# Patient Record
Sex: Female | Born: 1965 | Race: Black or African American | Hispanic: No | Smoking: Former smoker
Health system: Southern US, Community
[De-identification: ages and names within clinical notes are randomized; demographics above are authoritative.]

## PROBLEM LIST (undated history)

## (undated) DIAGNOSIS — I10 Essential (primary) hypertension: Secondary | ICD-10-CM

## (undated) DIAGNOSIS — B009 Herpesviral infection, unspecified: Secondary | ICD-10-CM

## (undated) DIAGNOSIS — E785 Hyperlipidemia, unspecified: Secondary | ICD-10-CM

## (undated) DIAGNOSIS — M545 Low back pain, unspecified: Secondary | ICD-10-CM

## (undated) DIAGNOSIS — Z973 Presence of spectacles and contact lenses: Secondary | ICD-10-CM

## (undated) DIAGNOSIS — M1990 Unspecified osteoarthritis, unspecified site: Secondary | ICD-10-CM

## (undated) DIAGNOSIS — E119 Type 2 diabetes mellitus without complications: Secondary | ICD-10-CM

## (undated) DIAGNOSIS — G709 Myoneural disorder, unspecified: Secondary | ICD-10-CM

## (undated) DIAGNOSIS — Z87442 Personal history of urinary calculi: Secondary | ICD-10-CM

## (undated) DIAGNOSIS — K219 Gastro-esophageal reflux disease without esophagitis: Secondary | ICD-10-CM

## (undated) DIAGNOSIS — D649 Anemia, unspecified: Secondary | ICD-10-CM

## (undated) DIAGNOSIS — T8859XA Other complications of anesthesia, initial encounter: Secondary | ICD-10-CM

## (undated) DIAGNOSIS — Z8669 Personal history of other diseases of the nervous system and sense organs: Secondary | ICD-10-CM

## (undated) DIAGNOSIS — K259 Gastric ulcer, unspecified as acute or chronic, without hemorrhage or perforation: Secondary | ICD-10-CM

## (undated) DIAGNOSIS — G4730 Sleep apnea, unspecified: Secondary | ICD-10-CM

## (undated) DIAGNOSIS — B019 Varicella without complication: Secondary | ICD-10-CM

## (undated) DIAGNOSIS — K449 Diaphragmatic hernia without obstruction or gangrene: Secondary | ICD-10-CM

## (undated) DIAGNOSIS — E039 Hypothyroidism, unspecified: Secondary | ICD-10-CM

## (undated) DIAGNOSIS — K279 Peptic ulcer, site unspecified, unspecified as acute or chronic, without hemorrhage or perforation: Secondary | ICD-10-CM

## (undated) DIAGNOSIS — E1169 Type 2 diabetes mellitus with other specified complication: Secondary | ICD-10-CM

## (undated) DIAGNOSIS — T7840XA Allergy, unspecified, initial encounter: Secondary | ICD-10-CM

## (undated) DIAGNOSIS — J45909 Unspecified asthma, uncomplicated: Secondary | ICD-10-CM

## (undated) DIAGNOSIS — F32A Depression, unspecified: Secondary | ICD-10-CM

## (undated) DIAGNOSIS — Z1211 Encounter for screening for malignant neoplasm of colon: Secondary | ICD-10-CM

## (undated) DIAGNOSIS — F419 Anxiety disorder, unspecified: Secondary | ICD-10-CM

## (undated) DIAGNOSIS — Z8719 Personal history of other diseases of the digestive system: Secondary | ICD-10-CM

## (undated) DIAGNOSIS — F329 Major depressive disorder, single episode, unspecified: Secondary | ICD-10-CM

## (undated) DIAGNOSIS — T4145XA Adverse effect of unspecified anesthetic, initial encounter: Secondary | ICD-10-CM

## (undated) DIAGNOSIS — E0500 Thyrotoxicosis with diffuse goiter without thyrotoxic crisis or storm: Secondary | ICD-10-CM

## (undated) HISTORY — DX: Low back pain, unspecified: M54.50

## (undated) HISTORY — DX: Type 2 diabetes mellitus with other specified complication: E11.69

## (undated) HISTORY — DX: Type 2 diabetes mellitus without complications: E11.9

## (undated) HISTORY — PX: ESOPHAGOGASTRODUODENOSCOPY: SHX1529

## (undated) HISTORY — DX: Allergy, unspecified, initial encounter: T78.40XA

## (undated) HISTORY — PX: ABDOMINAL HYSTERECTOMY: SHX81

## (undated) HISTORY — DX: Depression, unspecified: F32.A

## (undated) HISTORY — DX: Diaphragmatic hernia without obstruction or gangrene: K44.9

## (undated) HISTORY — DX: Encounter for screening for malignant neoplasm of colon: Z12.11

## (undated) HISTORY — DX: Sleep apnea, unspecified: G47.30

## (undated) HISTORY — DX: Essential (primary) hypertension: I10

## (undated) HISTORY — DX: Type 2 diabetes mellitus with other specified complication: E78.5

## (undated) HISTORY — DX: Peptic ulcer, site unspecified, unspecified as acute or chronic, without hemorrhage or perforation: K27.9

## (undated) HISTORY — DX: Unspecified asthma, uncomplicated: J45.909

## (undated) HISTORY — DX: Low back pain: M54.5

## (undated) HISTORY — DX: Gastro-esophageal reflux disease without esophagitis: K21.9

## (undated) HISTORY — DX: Hypothyroidism, unspecified: E03.9

## (undated) HISTORY — DX: Gastric ulcer, unspecified as acute or chronic, without hemorrhage or perforation: K25.9

## (undated) HISTORY — DX: Anxiety disorder, unspecified: F41.9

## (undated) HISTORY — PX: OTHER SURGICAL HISTORY: SHX169

## (undated) HISTORY — DX: Thyrotoxicosis with diffuse goiter without thyrotoxic crisis or storm: E05.00

## (undated) HISTORY — DX: Herpesviral infection, unspecified: B00.9

## (undated) HISTORY — DX: Major depressive disorder, single episode, unspecified: F32.9

## (undated) HISTORY — DX: Varicella without complication: B01.9

## (undated) SURGICAL SUPPLY — 15 items
CANNULA ANT/CHMB 27GA (MISCELLANEOUS) ×2 IMPLANT
GLOVE SURG LX 7.5 STRW (GLOVE) ×1
GLOVE SURG TRIUMPH 8.0 PF LTX (GLOVE) ×1
GOWN STRL REUS W/TWL LRG LVL3 (GOWN DISPOSABLE) ×2
LENS IOL TECNIS ITEC 21.0 (Intraocular Lens) ×1 IMPLANT
MARKER SKIN DUAL TIP RULER LAB (MISCELLANEOUS) ×1
NEEDLE FILTER BLUNT 18X 1/2SAF (NEEDLE) ×1
PACK CATARACT BRASINGTON (MISCELLANEOUS) ×1
PACK EYE AFTER SURG (MISCELLANEOUS) ×1
PACK OPTHALMIC (MISCELLANEOUS) ×1
SYR 3ML LL SCALE MARK (SYRINGE) ×1
SYR 5ML LL (SYRINGE) ×1
SYR TB 1ML LUER SLIP (SYRINGE) ×1
WATER STERILE IRR 500ML POUR (IV SOLUTION) ×1
WIPE NON LINTING 3.25X3.25 (MISCELLANEOUS) ×1

## (undated) SURGICAL SUPPLY — 15 items
CANNULA ANT/CHMB 27GA (MISCELLANEOUS) ×2 IMPLANT
GLOVE SURG LX 7.5 STRW (GLOVE) ×1
GLOVE SURG TRIUMPH 8.0 PF LTX (GLOVE) ×1
GOWN STRL REUS W/TWL LRG LVL3 (GOWN DISPOSABLE) ×2
LENS IOL TECNIS ITEC 20.5 (Intraocular Lens) ×1 IMPLANT
MARKER SKIN DUAL TIP RULER LAB (MISCELLANEOUS) ×1
NEEDLE FILTER BLUNT 18X 1/2SAF (NEEDLE) ×1
PACK CATARACT BRASINGTON (MISCELLANEOUS) ×1
PACK EYE AFTER SURG (MISCELLANEOUS) ×1
PACK OPTHALMIC (MISCELLANEOUS) ×1
SYR 3ML LL SCALE MARK (SYRINGE) ×1
SYR 5ML LL (SYRINGE) ×1
SYR TB 1ML LUER SLIP (SYRINGE) ×1
WATER STERILE IRR 500ML POUR (IV SOLUTION) ×1
WIPE NON LINTING 3.25X3.25 (MISCELLANEOUS) ×1

## (undated) SURGICAL SUPPLY — 12 items
BLOCK BITE 60FR ADLT L/F BLUE (MISCELLANEOUS) ×1
ELECT REM PT RETURN 9FT ADLT (ELECTROSURGICAL)
FORCEP RJ3 GP 1.8X160 W-NEEDLE (CUTTING FORCEPS)
FORCEPS BIOP RAD 4 LRG CAP 4 (CUTTING FORCEPS)
NEEDLE SCLEROTHERAPY 25GX240 (NEEDLE)
PROBE APC STR FIRE (PROBE)
PROBE INJECTION GOLD (MISCELLANEOUS)
SNARE SHORT THROW 13M SML OVAL (MISCELLANEOUS)
SYR 50ML LL SCALE MARK (SYRINGE)
TUBING ENDO SMARTCAP PENTAX (MISCELLANEOUS) ×2
TUBING IRRIGATION ENDOGATOR (MISCELLANEOUS) ×1
WATER STERILE IRR 1000ML POUR (IV SOLUTION)

## (undated) SURGICAL SUPPLY — 64 items
APL PRP STRL LF DISP 70% ISPRP (MISCELLANEOUS) ×9
APL SKNCLS STERI-STRIP NONHPOA (GAUZE/BANDAGES/DRESSINGS) ×6
BENZOIN TINCTURE PRP APPL 2/3 (GAUZE/BANDAGES/DRESSINGS) ×2
BINDER ABDOMINAL 12 SM 30-45 (SOFTGOODS) ×1
BLADE CLIPPER SURG (BLADE)
BLADE SURG 10 STRL SS (BLADE) ×5
BLADE SURG 11 STRL SS (BLADE) ×1
BLADE SURG 15 STRL SS (BLADE) ×4
BNDG CMPR MED 10X6 ELC LF (GAUZE/BANDAGES/DRESSINGS) ×3
BNDG ELASTIC 6X10 VLCR STRL LF (GAUZE/BANDAGES/DRESSINGS) ×1
CANISTER SUCT 1200ML W/VALVE (MISCELLANEOUS) ×1
CHLORAPREP W/TINT 26 (MISCELLANEOUS) ×3
COVER BACK TABLE 60X90IN (DRAPES) ×1
COVER MAYO STAND STRL (DRAPES) ×1
DRAIN CHANNEL 15F RND FF W/TCR (WOUND CARE)
DRAIN CHANNEL 19F RND (DRAIN) ×2
DRAIN PENROSE .5X12 LATEX STL (DRAIN) ×2
DRAPE LAPAROSCOPIC ABDOMINAL (DRAPES) ×1
DRAPE UTILITY XL STRL (DRAPES) ×2
DRSG PAD ABDOMINAL 8X10 ST (GAUZE/BANDAGES/DRESSINGS) ×4
ELECT BLADE 4.0 EZ CLEAN MEGAD (MISCELLANEOUS) ×8
ELECT COATED BLADE 2.86 ST (ELECTRODE)
ELECT REM PT RETURN 9FT ADLT (ELECTROSURGICAL) ×8
EVACUATOR SILICONE 100CC (DRAIN) ×2
GAUZE SPONGE 4X4 12PLY STRL (GAUZE/BANDAGES/DRESSINGS) ×2
GLOVE BIO SURGEON STRL SZ7.5 (GLOVE) ×2
GLOVE BIOGEL M STRL SZ7.5 (GLOVE) ×1
GLOVE BIOGEL PI INDICATOR 8 (GLOVE) ×2
GOWN STRL REUS W/TWL LRG LVL3 (GOWN DISPOSABLE) ×12
GOWN STRL REUS W/TWL XL LVL3 (GOWN DISPOSABLE) ×8
MARKER SKIN DUAL TIP RULER LAB (MISCELLANEOUS)
NEEDLE FILTER BLUNT 18X 1/2SAF (NEEDLE) ×1
NEEDLE HYPO 18GX1.5 SHARP (NEEDLE) ×4
NEEDLE HYPO 25X1 1.5 SAFETY (NEEDLE)
NEEDLE SPNL 18GX3.5 QUINCKE PK (NEEDLE) ×1
NS IRRIG 1000ML POUR BTL (IV SOLUTION) ×1
PACK BASIN DAY SURGERY FS (CUSTOM PROCEDURE TRAY) ×1
PENCIL SMOKE EVACUATOR (MISCELLANEOUS) ×2
PIN SAFETY STERILE (MISCELLANEOUS) ×1
SLEEVE SCD COMPRESS KNEE MED (STOCKING) ×1
SPONGE T-LAP 18X18 ~~LOC~~+RFID (SPONGE) ×6
STAPLER INSORB 30 2030 C-SECTI (MISCELLANEOUS) ×2
STAPLER VISISTAT 35W (STAPLE) ×1
STRIP SUTURE WOUND CLOSURE 1/2 (MISCELLANEOUS) ×3
SUT ETHILON 2 0 FS 18 (SUTURE) ×2
SUT MNCRL AB 4-0 PS2 18 (SUTURE) ×6
SUT NOVA 0 T19/GS 22DT (SUTURE) ×1
SUT PDS 3-0 CT2 (SUTURE) ×16
SUT PDS AB 0 CT 36 (SUTURE)
SUT PDS AB 2-0 CT2 27 (SUTURE)
SUT VIC AB 2-0 CT1 27 (SUTURE) ×20
SUT VIC AB 3-0 PS1 18 (SUTURE)
SUT VLOC 180 0 24IN GS25 (SUTURE)
SUT VLOC 180 P-14 24 (SUTURE)
SUT VLOC 90 P-14 23 (SUTURE) ×5
SYR 50ML LL SCALE MARK (SYRINGE) ×1
SYR BULB IRRIG 60ML STRL (SYRINGE) ×1
SYR CONTROL 10ML LL (SYRINGE)
TAPE MEASURE VINYL STERILE (MISCELLANEOUS)
TOWEL GREEN STERILE FF (TOWEL DISPOSABLE) ×2
TUBE CONNECTING 20X1/4 (TUBING) ×1
TUBING INFILTRATION IT-10001 (TUBING) ×1
UNDERPAD 30X36 HEAVY ABSORB (UNDERPADS AND DIAPERS) ×2
YANKAUER SUCT BULB TIP NO VENT (SUCTIONS) ×1

## (undated) SURGICAL SUPPLY — 2 items
ELECT REM PT RETURN 9FT ADLT (ELECTROSURGICAL)
PROBE INJECTION GOLD (MISCELLANEOUS)

---
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
## 1998-08-31 HISTORY — PX: CHOLECYSTECTOMY: SHX55

## 2001-08-31 HISTORY — PX: PARTIAL HYSTERECTOMY: SHX80

## 2006-08-31 HISTORY — PX: LAPAROSCOPIC GASTRIC BYPASS: SUR771

## 2011-09-01 HISTORY — PX: ROTATOR CUFF REPAIR: SHX139

## 2013-05-23 DIAGNOSIS — E1169 Type 2 diabetes mellitus with other specified complication: Secondary | ICD-10-CM | POA: Insufficient documentation

## 2013-05-23 DIAGNOSIS — D649 Anemia, unspecified: Secondary | ICD-10-CM | POA: Insufficient documentation

## 2013-05-23 DIAGNOSIS — E785 Hyperlipidemia, unspecified: Secondary | ICD-10-CM | POA: Insufficient documentation

## 2013-05-23 HISTORY — DX: Anemia, unspecified: D64.9

## 2013-07-18 DIAGNOSIS — N62 Hypertrophy of breast: Secondary | ICD-10-CM | POA: Insufficient documentation

## 2013-07-18 HISTORY — DX: Hypertrophy of breast: N62

## 2016-12-08 DIAGNOSIS — G63 Polyneuropathy in diseases classified elsewhere: Secondary | ICD-10-CM | POA: Diagnosis not present

## 2016-12-08 DIAGNOSIS — E0500 Thyrotoxicosis with diffuse goiter without thyrotoxic crisis or storm: Secondary | ICD-10-CM | POA: Diagnosis not present

## 2016-12-09 DIAGNOSIS — Z1231 Encounter for screening mammogram for malignant neoplasm of breast: Secondary | ICD-10-CM

## 2016-12-15 DIAGNOSIS — E0500 Thyrotoxicosis with diffuse goiter without thyrotoxic crisis or storm: Secondary | ICD-10-CM | POA: Diagnosis not present

## 2016-12-15 DIAGNOSIS — E119 Type 2 diabetes mellitus without complications: Secondary | ICD-10-CM | POA: Diagnosis not present

## 2016-12-25 DIAGNOSIS — E785 Hyperlipidemia, unspecified: Secondary | ICD-10-CM | POA: Diagnosis not present

## 2016-12-25 DIAGNOSIS — B1089 Other human herpesvirus infection: Secondary | ICD-10-CM | POA: Diagnosis not present

## 2016-12-25 DIAGNOSIS — E559 Vitamin D deficiency, unspecified: Secondary | ICD-10-CM | POA: Diagnosis not present

## 2016-12-28 DIAGNOSIS — E538 Deficiency of other specified B group vitamins: Secondary | ICD-10-CM | POA: Diagnosis not present

## 2017-02-11 DIAGNOSIS — E782 Mixed hyperlipidemia: Secondary | ICD-10-CM | POA: Diagnosis not present

## 2017-02-11 DIAGNOSIS — E890 Postprocedural hypothyroidism: Secondary | ICD-10-CM | POA: Diagnosis not present

## 2017-02-11 DIAGNOSIS — I10 Essential (primary) hypertension: Secondary | ICD-10-CM | POA: Diagnosis not present

## 2017-04-02 DIAGNOSIS — I10 Essential (primary) hypertension: Secondary | ICD-10-CM | POA: Diagnosis not present

## 2017-04-02 DIAGNOSIS — E119 Type 2 diabetes mellitus without complications: Secondary | ICD-10-CM | POA: Diagnosis not present

## 2017-04-05 DIAGNOSIS — E119 Type 2 diabetes mellitus without complications: Secondary | ICD-10-CM | POA: Diagnosis not present

## 2017-04-05 DIAGNOSIS — E785 Hyperlipidemia, unspecified: Secondary | ICD-10-CM | POA: Diagnosis not present

## 2017-04-05 DIAGNOSIS — I10 Essential (primary) hypertension: Secondary | ICD-10-CM | POA: Diagnosis not present

## 2017-04-16 DIAGNOSIS — Z8711 Personal history of peptic ulcer disease: Secondary | ICD-10-CM

## 2017-04-16 DIAGNOSIS — Z8719 Personal history of other diseases of the digestive system: Secondary | ICD-10-CM

## 2017-04-16 DIAGNOSIS — Z1211 Encounter for screening for malignant neoplasm of colon: Secondary | ICD-10-CM

## 2017-04-30 HISTORY — DX: Personal history of other diseases of the nervous system and sense organs: Z86.69

## 2017-04-30 HISTORY — DX: Presence of spectacles and contact lenses: Z97.3

## 2017-07-05 DIAGNOSIS — E119 Type 2 diabetes mellitus without complications: Secondary | ICD-10-CM | POA: Diagnosis not present

## 2017-07-05 DIAGNOSIS — E890 Postprocedural hypothyroidism: Secondary | ICD-10-CM | POA: Diagnosis not present

## 2017-08-13 DIAGNOSIS — E890 Postprocedural hypothyroidism: Secondary | ICD-10-CM | POA: Diagnosis not present

## 2017-08-17 DIAGNOSIS — E890 Postprocedural hypothyroidism: Secondary | ICD-10-CM | POA: Diagnosis not present

## 2017-08-17 DIAGNOSIS — E119 Type 2 diabetes mellitus without complications: Secondary | ICD-10-CM | POA: Diagnosis not present

## 2017-09-08 VITALS — BP 126/58 | HR 87 | Temp 98.1°F | Resp 16 | Ht 61.75 in | Wt 208.0 lb

## 2017-09-08 DIAGNOSIS — N907 Vulvar cyst: Secondary | ICD-10-CM

## 2017-09-08 DIAGNOSIS — N6001 Solitary cyst of right breast: Secondary | ICD-10-CM

## 2017-09-08 DIAGNOSIS — G4700 Insomnia, unspecified: Secondary | ICD-10-CM

## 2017-09-08 DIAGNOSIS — E119 Type 2 diabetes mellitus without complications: Secondary | ICD-10-CM

## 2017-09-08 DIAGNOSIS — E890 Postprocedural hypothyroidism: Secondary | ICD-10-CM

## 2017-09-08 DIAGNOSIS — I10 Essential (primary) hypertension: Secondary | ICD-10-CM

## 2017-09-08 DIAGNOSIS — L739 Follicular disorder, unspecified: Secondary | ICD-10-CM

## 2017-09-08 DIAGNOSIS — F419 Anxiety disorder, unspecified: Secondary | ICD-10-CM | POA: Insufficient documentation

## 2017-09-08 DIAGNOSIS — J4520 Mild intermittent asthma, uncomplicated: Secondary | ICD-10-CM

## 2017-09-08 DIAGNOSIS — F4321 Adjustment disorder with depressed mood: Secondary | ICD-10-CM

## 2017-09-08 DIAGNOSIS — E669 Obesity, unspecified: Secondary | ICD-10-CM

## 2017-09-08 DIAGNOSIS — Z1231 Encounter for screening mammogram for malignant neoplasm of breast: Secondary | ICD-10-CM

## 2017-09-08 DIAGNOSIS — J45909 Unspecified asthma, uncomplicated: Secondary | ICD-10-CM | POA: Insufficient documentation

## 2017-09-08 HISTORY — DX: Adjustment disorder with depressed mood: F43.21

## 2017-09-08 HISTORY — DX: Insomnia, unspecified: G47.00

## 2017-09-08 HISTORY — DX: Postprocedural hypothyroidism: E89.0

## 2017-09-08 HISTORY — DX: Type 2 diabetes mellitus without complications: E11.9

## 2017-09-08 HISTORY — DX: Solitary cyst of right breast: N60.01

## 2017-09-08 HISTORY — DX: Essential (primary) hypertension: I10

## 2017-09-08 HISTORY — DX: Vulvar cyst: N90.7

## 2017-10-07 DIAGNOSIS — Z0289 Encounter for other administrative examinations: Secondary | ICD-10-CM

## 2017-10-22 DIAGNOSIS — E119 Type 2 diabetes mellitus without complications: Secondary | ICD-10-CM

## 2017-11-22 DIAGNOSIS — G4700 Insomnia, unspecified: Secondary | ICD-10-CM

## 2017-11-25 VITALS — BP 132/70 | HR 89 | Temp 97.9°F | Ht 61.75 in | Wt 206.6 lb

## 2017-11-25 DIAGNOSIS — F32A Depression, unspecified: Secondary | ICD-10-CM

## 2017-11-25 DIAGNOSIS — G4700 Insomnia, unspecified: Secondary | ICD-10-CM

## 2017-11-25 DIAGNOSIS — E119 Type 2 diabetes mellitus without complications: Secondary | ICD-10-CM

## 2017-11-25 DIAGNOSIS — I10 Essential (primary) hypertension: Secondary | ICD-10-CM | POA: Diagnosis not present

## 2017-11-25 DIAGNOSIS — E890 Postprocedural hypothyroidism: Secondary | ICD-10-CM

## 2017-11-25 DIAGNOSIS — E669 Obesity, unspecified: Secondary | ICD-10-CM

## 2017-11-25 DIAGNOSIS — J4520 Mild intermittent asthma, uncomplicated: Secondary | ICD-10-CM

## 2017-11-25 DIAGNOSIS — E559 Vitamin D deficiency, unspecified: Secondary | ICD-10-CM | POA: Insufficient documentation

## 2017-11-25 DIAGNOSIS — F419 Anxiety disorder, unspecified: Secondary | ICD-10-CM | POA: Insufficient documentation

## 2017-11-25 DIAGNOSIS — F329 Major depressive disorder, single episode, unspecified: Secondary | ICD-10-CM

## 2017-11-25 HISTORY — DX: Depression, unspecified: F32.A

## 2017-11-25 HISTORY — DX: Anxiety disorder, unspecified: F41.9

## 2017-12-07 DIAGNOSIS — E559 Vitamin D deficiency, unspecified: Secondary | ICD-10-CM

## 2017-12-14 VITALS — BP 106/64 | HR 91 | Temp 97.9°F | Ht 61.75 in | Wt 212.8 lb

## 2017-12-14 DIAGNOSIS — Z720 Tobacco use: Secondary | ICD-10-CM

## 2017-12-14 DIAGNOSIS — J069 Acute upper respiratory infection, unspecified: Secondary | ICD-10-CM

## 2017-12-14 DIAGNOSIS — M5441 Lumbago with sciatica, right side: Secondary | ICD-10-CM | POA: Diagnosis not present

## 2017-12-27 VITALS — BP 110/64 | HR 94 | Temp 98.1°F | Ht 61.75 in | Wt 203.8 lb

## 2017-12-27 DIAGNOSIS — E669 Obesity, unspecified: Secondary | ICD-10-CM | POA: Diagnosis not present

## 2017-12-27 DIAGNOSIS — J069 Acute upper respiratory infection, unspecified: Secondary | ICD-10-CM

## 2017-12-27 DIAGNOSIS — G8929 Other chronic pain: Secondary | ICD-10-CM

## 2017-12-27 DIAGNOSIS — M5441 Lumbago with sciatica, right side: Secondary | ICD-10-CM

## 2017-12-27 DIAGNOSIS — M545 Low back pain, unspecified: Secondary | ICD-10-CM

## 2017-12-27 DIAGNOSIS — G4700 Insomnia, unspecified: Secondary | ICD-10-CM | POA: Diagnosis not present

## 2017-12-27 DIAGNOSIS — Z720 Tobacco use: Secondary | ICD-10-CM

## 2017-12-27 DIAGNOSIS — Z1231 Encounter for screening mammogram for malignant neoplasm of breast: Secondary | ICD-10-CM | POA: Diagnosis not present

## 2017-12-27 DIAGNOSIS — I10 Essential (primary) hypertension: Secondary | ICD-10-CM

## 2017-12-27 DIAGNOSIS — M5442 Lumbago with sciatica, left side: Secondary | ICD-10-CM

## 2017-12-27 HISTORY — DX: Low back pain, unspecified: M54.50

## 2017-12-27 HISTORY — DX: Other chronic pain: G89.29

## 2018-01-25 VITALS — BP 118/68 | HR 102 | Temp 98.0°F | Resp 16 | Ht 61.75 in | Wt 205.5 lb

## 2018-01-25 DIAGNOSIS — M5442 Lumbago with sciatica, left side: Secondary | ICD-10-CM

## 2018-01-25 DIAGNOSIS — F419 Anxiety disorder, unspecified: Secondary | ICD-10-CM

## 2018-01-25 DIAGNOSIS — G4700 Insomnia, unspecified: Secondary | ICD-10-CM

## 2018-01-25 DIAGNOSIS — M5441 Lumbago with sciatica, right side: Secondary | ICD-10-CM

## 2018-01-25 DIAGNOSIS — E119 Type 2 diabetes mellitus without complications: Secondary | ICD-10-CM

## 2018-01-25 DIAGNOSIS — M5416 Radiculopathy, lumbar region: Secondary | ICD-10-CM | POA: Diagnosis not present

## 2018-01-25 DIAGNOSIS — E890 Postprocedural hypothyroidism: Secondary | ICD-10-CM

## 2018-01-25 DIAGNOSIS — I10 Essential (primary) hypertension: Secondary | ICD-10-CM | POA: Diagnosis not present

## 2018-01-25 DIAGNOSIS — E669 Obesity, unspecified: Secondary | ICD-10-CM

## 2018-01-25 HISTORY — DX: Radiculopathy, lumbar region: M54.16

## 2018-02-01 DIAGNOSIS — E119 Type 2 diabetes mellitus without complications: Secondary | ICD-10-CM | POA: Diagnosis not present

## 2018-02-01 DIAGNOSIS — E890 Postprocedural hypothyroidism: Secondary | ICD-10-CM | POA: Diagnosis not present

## 2018-03-07 DIAGNOSIS — Z6837 Body mass index (BMI) 37.0-37.9, adult: Principal | ICD-10-CM

## 2018-03-30 VITALS — BP 108/76 | HR 110 | Temp 97.6°F | Ht 61.75 in | Wt 188.4 lb

## 2018-03-30 DIAGNOSIS — E119 Type 2 diabetes mellitus without complications: Secondary | ICD-10-CM

## 2018-03-30 DIAGNOSIS — I10 Essential (primary) hypertension: Secondary | ICD-10-CM

## 2018-03-30 DIAGNOSIS — M5442 Lumbago with sciatica, left side: Secondary | ICD-10-CM

## 2018-03-30 DIAGNOSIS — M5416 Radiculopathy, lumbar region: Secondary | ICD-10-CM | POA: Diagnosis not present

## 2018-03-30 DIAGNOSIS — M5441 Lumbago with sciatica, right side: Secondary | ICD-10-CM

## 2018-03-30 DIAGNOSIS — R112 Nausea with vomiting, unspecified: Secondary | ICD-10-CM | POA: Diagnosis not present

## 2018-03-30 DIAGNOSIS — E559 Vitamin D deficiency, unspecified: Secondary | ICD-10-CM | POA: Diagnosis not present

## 2018-03-30 DIAGNOSIS — G4700 Insomnia, unspecified: Secondary | ICD-10-CM

## 2018-03-31 DIAGNOSIS — Z9884 Bariatric surgery status: Secondary | ICD-10-CM | POA: Diagnosis not present

## 2018-03-31 DIAGNOSIS — R112 Nausea with vomiting, unspecified: Secondary | ICD-10-CM | POA: Diagnosis not present

## 2018-03-31 DIAGNOSIS — Z9049 Acquired absence of other specified parts of digestive tract: Secondary | ICD-10-CM | POA: Diagnosis not present

## 2018-04-01 DIAGNOSIS — R112 Nausea with vomiting, unspecified: Secondary | ICD-10-CM

## 2018-04-08 VITALS — BP 106/70 | HR 100 | Temp 98.1°F | Ht 61.75 in | Wt 192.8 lb

## 2018-04-08 DIAGNOSIS — E119 Type 2 diabetes mellitus without complications: Secondary | ICD-10-CM

## 2018-04-08 DIAGNOSIS — I10 Essential (primary) hypertension: Secondary | ICD-10-CM

## 2018-04-08 DIAGNOSIS — R112 Nausea with vomiting, unspecified: Secondary | ICD-10-CM | POA: Diagnosis not present

## 2018-04-08 DIAGNOSIS — R5383 Other fatigue: Secondary | ICD-10-CM | POA: Diagnosis not present

## 2018-04-08 DIAGNOSIS — R609 Edema, unspecified: Secondary | ICD-10-CM

## 2018-04-08 DIAGNOSIS — M5416 Radiculopathy, lumbar region: Secondary | ICD-10-CM

## 2018-04-08 HISTORY — DX: Nausea with vomiting, unspecified: R11.2

## 2018-04-08 HISTORY — DX: Edema, unspecified: R60.9

## 2018-04-13 VITALS — BP 137/80 | HR 112 | Resp 18 | Ht 61.0 in | Wt 184.2 lb

## 2018-04-13 DIAGNOSIS — R112 Nausea with vomiting, unspecified: Secondary | ICD-10-CM

## 2018-04-13 DIAGNOSIS — R634 Abnormal weight loss: Secondary | ICD-10-CM

## 2018-04-13 DIAGNOSIS — Z1211 Encounter for screening for malignant neoplasm of colon: Secondary | ICD-10-CM

## 2018-04-28 DIAGNOSIS — J45909 Unspecified asthma, uncomplicated: Secondary | ICD-10-CM | POA: Insufficient documentation

## 2018-04-28 DIAGNOSIS — K222 Esophageal obstruction: Secondary | ICD-10-CM | POA: Diagnosis not present

## 2018-04-28 DIAGNOSIS — K644 Residual hemorrhoidal skin tags: Secondary | ICD-10-CM | POA: Diagnosis not present

## 2018-04-28 DIAGNOSIS — K219 Gastro-esophageal reflux disease without esophagitis: Secondary | ICD-10-CM | POA: Insufficient documentation

## 2018-04-28 DIAGNOSIS — Z90710 Acquired absence of both cervix and uterus: Secondary | ICD-10-CM | POA: Insufficient documentation

## 2018-04-28 DIAGNOSIS — Z809 Family history of malignant neoplasm, unspecified: Secondary | ICD-10-CM | POA: Insufficient documentation

## 2018-04-28 DIAGNOSIS — F329 Major depressive disorder, single episode, unspecified: Secondary | ICD-10-CM | POA: Diagnosis not present

## 2018-04-28 DIAGNOSIS — K9189 Other postprocedural complications and disorders of digestive system: Secondary | ICD-10-CM | POA: Diagnosis not present

## 2018-04-28 DIAGNOSIS — F419 Anxiety disorder, unspecified: Secondary | ICD-10-CM | POA: Diagnosis not present

## 2018-04-28 DIAGNOSIS — Z8711 Personal history of peptic ulcer disease: Secondary | ICD-10-CM | POA: Diagnosis not present

## 2018-04-28 DIAGNOSIS — D122 Benign neoplasm of ascending colon: Secondary | ICD-10-CM | POA: Diagnosis not present

## 2018-04-28 DIAGNOSIS — I10 Essential (primary) hypertension: Secondary | ICD-10-CM | POA: Diagnosis not present

## 2018-04-28 DIAGNOSIS — Z811 Family history of alcohol abuse and dependence: Secondary | ICD-10-CM | POA: Insufficient documentation

## 2018-04-28 DIAGNOSIS — G4730 Sleep apnea, unspecified: Secondary | ICD-10-CM | POA: Diagnosis not present

## 2018-04-28 DIAGNOSIS — E785 Hyperlipidemia, unspecified: Secondary | ICD-10-CM | POA: Insufficient documentation

## 2018-04-28 DIAGNOSIS — E119 Type 2 diabetes mellitus without complications: Secondary | ICD-10-CM | POA: Insufficient documentation

## 2018-04-28 DIAGNOSIS — K648 Other hemorrhoids: Secondary | ICD-10-CM | POA: Insufficient documentation

## 2018-04-28 DIAGNOSIS — E890 Postprocedural hypothyroidism: Secondary | ICD-10-CM | POA: Diagnosis not present

## 2018-04-28 DIAGNOSIS — R112 Nausea with vomiting, unspecified: Secondary | ICD-10-CM | POA: Insufficient documentation

## 2018-04-28 DIAGNOSIS — Z825 Family history of asthma and other chronic lower respiratory diseases: Secondary | ICD-10-CM | POA: Insufficient documentation

## 2018-04-28 DIAGNOSIS — Y832 Surgical operation with anastomosis, bypass or graft as the cause of abnormal reaction of the patient, or of later complication, without mention of misadventure at the time of the procedure: Secondary | ICD-10-CM | POA: Insufficient documentation

## 2018-04-28 DIAGNOSIS — Z8489 Family history of other specified conditions: Secondary | ICD-10-CM | POA: Insufficient documentation

## 2018-04-28 DIAGNOSIS — Z9049 Acquired absence of other specified parts of digestive tract: Secondary | ICD-10-CM | POA: Diagnosis not present

## 2018-04-28 DIAGNOSIS — K635 Polyp of colon: Secondary | ICD-10-CM | POA: Diagnosis not present

## 2018-04-28 DIAGNOSIS — Z833 Family history of diabetes mellitus: Secondary | ICD-10-CM | POA: Insufficient documentation

## 2018-04-28 DIAGNOSIS — Z1211 Encounter for screening for malignant neoplasm of colon: Secondary | ICD-10-CM | POA: Insufficient documentation

## 2018-04-28 DIAGNOSIS — F17210 Nicotine dependence, cigarettes, uncomplicated: Secondary | ICD-10-CM | POA: Insufficient documentation

## 2018-04-28 DIAGNOSIS — Z9884 Bariatric surgery status: Secondary | ICD-10-CM | POA: Insufficient documentation

## 2018-04-28 DIAGNOSIS — D126 Benign neoplasm of colon, unspecified: Secondary | ICD-10-CM | POA: Diagnosis not present

## 2018-04-28 DIAGNOSIS — Z8261 Family history of arthritis: Secondary | ICD-10-CM | POA: Insufficient documentation

## 2018-04-28 DIAGNOSIS — Z8249 Family history of ischemic heart disease and other diseases of the circulatory system: Secondary | ICD-10-CM | POA: Insufficient documentation

## 2018-04-28 DIAGNOSIS — Z9109 Other allergy status, other than to drugs and biological substances: Secondary | ICD-10-CM | POA: Insufficient documentation

## 2018-04-28 DIAGNOSIS — Z7951 Long term (current) use of inhaled steroids: Secondary | ICD-10-CM | POA: Insufficient documentation

## 2018-04-28 DIAGNOSIS — Z79899 Other long term (current) drug therapy: Secondary | ICD-10-CM | POA: Insufficient documentation

## 2018-04-28 DIAGNOSIS — Z91040 Latex allergy status: Secondary | ICD-10-CM | POA: Insufficient documentation

## 2018-04-28 HISTORY — PX: COLONOSCOPY WITH PROPOFOL: SHX5780

## 2018-04-28 HISTORY — PX: ESOPHAGOGASTRODUODENOSCOPY (EGD) WITH PROPOFOL: SHX5813

## 2018-04-28 MED ADMIN — Propofol IV Emul 500 MG/50ML (10 MG/ML): 120 ug/kg/min | INTRAVENOUS | NDC 00409469933

## 2018-04-28 MED ADMIN — Fentanyl Citrate Preservative Free (PF) Inj 100 MCG/2ML: 25 ug | INTRAVENOUS | NDC 00409909422

## 2018-04-28 MED ADMIN — Fentanyl Citrate Preservative Free (PF) Inj 100 MCG/2ML: 50 ug | INTRAVENOUS | NDC 00409909422

## 2018-04-28 MED ADMIN — Butamben-Tetracaine-Benzocaine Aerosol Spray 2-2-14%: 2 | TOPICAL | NDC 10223020104

## 2018-04-28 MED ADMIN — Midazolam HCl Inj 2 MG/2ML (Base Equivalent): 2 mg | INTRAVENOUS | NDC 63323041112

## 2018-04-28 MED ADMIN — Lidocaine HCl(Cardiac) IV PF Soln Pref Syr 100 MG/5ML (2%): 30 mg | INTRAVENOUS | NDC 00409132305

## 2018-04-28 MED ADMIN — Sodium Chloride IV Soln 0.9%: 1000 mL | INTRAVENOUS | NDC 00338004904

## 2018-04-29 VITALS — BP 130/78 | HR 97 | Temp 98.2°F | Ht 61.0 in | Wt 186.8 lb

## 2018-04-29 DIAGNOSIS — Z9884 Bariatric surgery status: Secondary | ICD-10-CM

## 2018-04-29 DIAGNOSIS — K311 Adult hypertrophic pyloric stenosis: Secondary | ICD-10-CM

## 2018-04-29 DIAGNOSIS — I10 Essential (primary) hypertension: Secondary | ICD-10-CM

## 2018-04-29 DIAGNOSIS — G8929 Other chronic pain: Secondary | ICD-10-CM

## 2018-04-29 DIAGNOSIS — M545 Low back pain: Secondary | ICD-10-CM

## 2018-04-29 DIAGNOSIS — R112 Nausea with vomiting, unspecified: Secondary | ICD-10-CM | POA: Diagnosis not present

## 2018-04-29 HISTORY — DX: Bariatric surgery status: Z98.84

## 2018-05-05 DIAGNOSIS — R112 Nausea with vomiting, unspecified: Secondary | ICD-10-CM

## 2018-05-19 DIAGNOSIS — R112 Nausea with vomiting, unspecified: Secondary | ICD-10-CM | POA: Diagnosis not present

## 2018-05-19 DIAGNOSIS — K9189 Other postprocedural complications and disorders of digestive system: Secondary | ICD-10-CM | POA: Diagnosis not present

## 2018-05-19 DIAGNOSIS — Z9884 Bariatric surgery status: Secondary | ICD-10-CM | POA: Diagnosis not present

## 2018-05-19 DIAGNOSIS — E119 Type 2 diabetes mellitus without complications: Secondary | ICD-10-CM | POA: Diagnosis not present

## 2018-06-10 DIAGNOSIS — S3633XA Laceration of stomach, initial encounter: Secondary | ICD-10-CM | POA: Diagnosis not present

## 2018-06-10 DIAGNOSIS — K631 Perforation of intestine (nontraumatic): Secondary | ICD-10-CM | POA: Diagnosis not present

## 2018-06-10 DIAGNOSIS — E11649 Type 2 diabetes mellitus with hypoglycemia without coma: Secondary | ICD-10-CM | POA: Diagnosis not present

## 2018-06-10 DIAGNOSIS — R1013 Epigastric pain: Secondary | ICD-10-CM | POA: Diagnosis not present

## 2018-06-10 DIAGNOSIS — R1012 Left upper quadrant pain: Secondary | ICD-10-CM | POA: Diagnosis not present

## 2018-06-10 DIAGNOSIS — K9189 Other postprocedural complications and disorders of digestive system: Secondary | ICD-10-CM | POA: Diagnosis not present

## 2018-06-10 DIAGNOSIS — E43 Unspecified severe protein-calorie malnutrition: Secondary | ICD-10-CM | POA: Diagnosis not present

## 2018-06-10 DIAGNOSIS — Z9884 Bariatric surgery status: Secondary | ICD-10-CM | POA: Diagnosis not present

## 2018-06-10 DIAGNOSIS — R51 Headache: Secondary | ICD-10-CM | POA: Diagnosis not present

## 2018-06-14 MED ORDER — GENERIC EXTERNAL MEDICATION
40.00 | Status: DC
Start: 2018-06-15 — End: 2018-06-14

## 2018-06-14 MED ORDER — GENERIC EXTERNAL MEDICATION
12.50 | Status: DC
Start: ? — End: 2018-06-14

## 2018-06-14 MED ORDER — DOCUSATE SODIUM 150 MG/15ML PO LIQD
100.00 | ORAL | Status: DC
Start: 2018-06-14 — End: 2018-06-14

## 2018-06-14 MED ORDER — ACETAMINOPHEN 650 MG/20.3ML PO SOLN
1000.00 | ORAL | Status: DC
Start: 2018-06-15 — End: 2018-06-14

## 2018-06-14 MED ORDER — INSULIN LISPRO 100 UNIT/ML ~~LOC~~ SOLN
0.00 | SUBCUTANEOUS | Status: DC
Start: 2018-06-14 — End: 2018-06-14

## 2018-06-14 MED ORDER — ONDANSETRON 4 MG PO TBDP
4.00 | ORAL_TABLET | ORAL | Status: DC
Start: 2018-06-15 — End: 2018-06-14

## 2018-06-14 MED ORDER — GENERIC EXTERNAL MEDICATION
5.00 | Status: DC
Start: ? — End: 2018-06-14

## 2018-06-14 MED ORDER — SCOPOLAMINE 1 MG/3DAYS TD PT72
1.00 | MEDICATED_PATCH | TRANSDERMAL | Status: DC
Start: 2018-06-16 — End: 2018-06-14

## 2018-06-14 MED ORDER — HEPARIN SODIUM (PORCINE) 5000 UNIT/ML IJ SOLN
5000.00 | INTRAMUSCULAR | Status: DC
Start: 2018-06-14 — End: 2018-06-14

## 2018-06-14 MED ORDER — DEXTROSE 10 % IV SOLN
25.00 | INTRAVENOUS | Status: DC
Start: ? — End: 2018-06-14

## 2018-06-14 MED ORDER — GENERIC EXTERNAL MEDICATION
66.00 | Status: DC
Start: 2018-06-15 — End: 2018-06-14

## 2018-06-17 DIAGNOSIS — E039 Hypothyroidism, unspecified: Secondary | ICD-10-CM

## 2018-06-30 VITALS — BP 116/64 | HR 90 | Temp 98.3°F | Ht 61.0 in | Wt 180.0 lb

## 2018-06-30 DIAGNOSIS — K311 Adult hypertrophic pyloric stenosis: Secondary | ICD-10-CM | POA: Diagnosis not present

## 2018-06-30 DIAGNOSIS — Z9884 Bariatric surgery status: Secondary | ICD-10-CM | POA: Diagnosis not present

## 2018-06-30 DIAGNOSIS — R112 Nausea with vomiting, unspecified: Secondary | ICD-10-CM

## 2018-06-30 DIAGNOSIS — D62 Acute posthemorrhagic anemia: Secondary | ICD-10-CM | POA: Diagnosis not present

## 2018-06-30 DIAGNOSIS — M5441 Lumbago with sciatica, right side: Secondary | ICD-10-CM

## 2018-06-30 DIAGNOSIS — I10 Essential (primary) hypertension: Secondary | ICD-10-CM | POA: Diagnosis not present

## 2018-06-30 DIAGNOSIS — M5442 Lumbago with sciatica, left side: Secondary | ICD-10-CM

## 2018-07-01 DIAGNOSIS — D62 Acute posthemorrhagic anemia: Secondary | ICD-10-CM | POA: Insufficient documentation

## 2018-07-04 DIAGNOSIS — K285 Chronic or unspecified gastrojejunal ulcer with perforation: Secondary | ICD-10-CM | POA: Diagnosis not present

## 2018-07-04 DIAGNOSIS — K311 Adult hypertrophic pyloric stenosis: Secondary | ICD-10-CM | POA: Diagnosis not present

## 2018-07-04 DIAGNOSIS — Z9884 Bariatric surgery status: Secondary | ICD-10-CM | POA: Diagnosis not present

## 2018-08-02 DIAGNOSIS — G4700 Insomnia, unspecified: Secondary | ICD-10-CM

## 2018-08-22 DIAGNOSIS — E039 Hypothyroidism, unspecified: Secondary | ICD-10-CM | POA: Diagnosis not present

## 2018-08-22 DIAGNOSIS — Z9884 Bariatric surgery status: Secondary | ICD-10-CM | POA: Diagnosis not present

## 2018-08-22 DIAGNOSIS — E119 Type 2 diabetes mellitus without complications: Secondary | ICD-10-CM | POA: Diagnosis not present

## 2018-09-01 DIAGNOSIS — F17200 Nicotine dependence, unspecified, uncomplicated: Secondary | ICD-10-CM | POA: Insufficient documentation

## 2018-09-01 DIAGNOSIS — E782 Mixed hyperlipidemia: Secondary | ICD-10-CM | POA: Insufficient documentation

## 2018-09-01 HISTORY — DX: Mixed hyperlipidemia: E78.2

## 2018-09-26 DIAGNOSIS — E876 Hypokalemia: Secondary | ICD-10-CM

## 2018-09-26 DIAGNOSIS — E559 Vitamin D deficiency, unspecified: Secondary | ICD-10-CM

## 2018-09-26 DIAGNOSIS — E785 Hyperlipidemia, unspecified: Secondary | ICD-10-CM

## 2018-09-26 DIAGNOSIS — E611 Iron deficiency: Secondary | ICD-10-CM

## 2018-09-26 DIAGNOSIS — E039 Hypothyroidism, unspecified: Secondary | ICD-10-CM

## 2018-09-26 DIAGNOSIS — E119 Type 2 diabetes mellitus without complications: Secondary | ICD-10-CM

## 2018-09-26 DIAGNOSIS — E538 Deficiency of other specified B group vitamins: Secondary | ICD-10-CM

## 2018-09-26 DIAGNOSIS — I10 Essential (primary) hypertension: Secondary | ICD-10-CM

## 2018-09-30 VITALS — BP 122/84 | HR 112 | Temp 97.6°F | Ht 61.0 in | Wt 185.6 lb

## 2018-09-30 DIAGNOSIS — K311 Adult hypertrophic pyloric stenosis: Secondary | ICD-10-CM | POA: Diagnosis not present

## 2018-09-30 DIAGNOSIS — F32A Depression, unspecified: Secondary | ICD-10-CM

## 2018-09-30 DIAGNOSIS — E119 Type 2 diabetes mellitus without complications: Secondary | ICD-10-CM

## 2018-09-30 DIAGNOSIS — F419 Anxiety disorder, unspecified: Secondary | ICD-10-CM

## 2018-09-30 DIAGNOSIS — Z23 Encounter for immunization: Secondary | ICD-10-CM | POA: Diagnosis not present

## 2018-09-30 DIAGNOSIS — E890 Postprocedural hypothyroidism: Secondary | ICD-10-CM

## 2018-09-30 DIAGNOSIS — I10 Essential (primary) hypertension: Secondary | ICD-10-CM

## 2018-09-30 DIAGNOSIS — F329 Major depressive disorder, single episode, unspecified: Secondary | ICD-10-CM

## 2018-09-30 DIAGNOSIS — R112 Nausea with vomiting, unspecified: Secondary | ICD-10-CM

## 2018-10-07 DIAGNOSIS — Z1231 Encounter for screening mammogram for malignant neoplasm of breast: Secondary | ICD-10-CM | POA: Diagnosis not present

## 2018-10-07 DIAGNOSIS — N6001 Solitary cyst of right breast: Secondary | ICD-10-CM | POA: Diagnosis not present

## 2018-10-10 DIAGNOSIS — R928 Other abnormal and inconclusive findings on diagnostic imaging of breast: Secondary | ICD-10-CM

## 2018-10-10 DIAGNOSIS — E119 Type 2 diabetes mellitus without complications: Secondary | ICD-10-CM | POA: Diagnosis not present

## 2018-10-10 DIAGNOSIS — Z9884 Bariatric surgery status: Secondary | ICD-10-CM | POA: Diagnosis not present

## 2018-10-10 DIAGNOSIS — N6489 Other specified disorders of breast: Secondary | ICD-10-CM

## 2018-10-20 DIAGNOSIS — N6489 Other specified disorders of breast: Secondary | ICD-10-CM

## 2018-10-20 DIAGNOSIS — R928 Other abnormal and inconclusive findings on diagnostic imaging of breast: Secondary | ICD-10-CM

## 2018-10-25 DIAGNOSIS — G4700 Insomnia, unspecified: Secondary | ICD-10-CM

## 2018-10-25 DIAGNOSIS — I10 Essential (primary) hypertension: Secondary | ICD-10-CM

## 2018-11-11 DIAGNOSIS — R1310 Dysphagia, unspecified: Secondary | ICD-10-CM | POA: Diagnosis not present

## 2018-11-11 DIAGNOSIS — Z9884 Bariatric surgery status: Secondary | ICD-10-CM | POA: Diagnosis not present

## 2018-11-11 DIAGNOSIS — F17200 Nicotine dependence, unspecified, uncomplicated: Secondary | ICD-10-CM | POA: Diagnosis not present

## 2018-11-15 DIAGNOSIS — H2513 Age-related nuclear cataract, bilateral: Secondary | ICD-10-CM | POA: Diagnosis not present

## 2018-11-16 DIAGNOSIS — H2511 Age-related nuclear cataract, right eye: Secondary | ICD-10-CM | POA: Diagnosis not present

## 2018-11-23 DIAGNOSIS — F17200 Nicotine dependence, unspecified, uncomplicated: Secondary | ICD-10-CM

## 2018-11-23 DIAGNOSIS — E119 Type 2 diabetes mellitus without complications: Secondary | ICD-10-CM | POA: Diagnosis not present

## 2018-11-23 DIAGNOSIS — Z9884 Bariatric surgery status: Secondary | ICD-10-CM | POA: Diagnosis not present

## 2018-11-23 DIAGNOSIS — E039 Hypothyroidism, unspecified: Secondary | ICD-10-CM | POA: Diagnosis not present

## 2018-12-01 DIAGNOSIS — R112 Nausea with vomiting, unspecified: Secondary | ICD-10-CM

## 2018-12-13 DIAGNOSIS — F32A Depression, unspecified: Secondary | ICD-10-CM

## 2018-12-13 DIAGNOSIS — E785 Hyperlipidemia, unspecified: Secondary | ICD-10-CM

## 2018-12-13 DIAGNOSIS — F419 Anxiety disorder, unspecified: Secondary | ICD-10-CM

## 2018-12-13 DIAGNOSIS — F329 Major depressive disorder, single episode, unspecified: Secondary | ICD-10-CM

## 2018-12-13 DIAGNOSIS — E119 Type 2 diabetes mellitus without complications: Secondary | ICD-10-CM

## 2018-12-21 DIAGNOSIS — I10 Essential (primary) hypertension: Secondary | ICD-10-CM | POA: Diagnosis not present

## 2018-12-21 DIAGNOSIS — K3189 Other diseases of stomach and duodenum: Secondary | ICD-10-CM | POA: Diagnosis not present

## 2018-12-21 DIAGNOSIS — Z9049 Acquired absence of other specified parts of digestive tract: Secondary | ICD-10-CM | POA: Diagnosis not present

## 2018-12-30 DIAGNOSIS — E119 Type 2 diabetes mellitus without complications: Secondary | ICD-10-CM

## 2019-01-04 DIAGNOSIS — I10 Essential (primary) hypertension: Secondary | ICD-10-CM

## 2019-01-04 DIAGNOSIS — E119 Type 2 diabetes mellitus without complications: Secondary | ICD-10-CM | POA: Diagnosis not present

## 2019-01-04 DIAGNOSIS — Z720 Tobacco use: Secondary | ICD-10-CM

## 2019-01-04 DIAGNOSIS — E785 Hyperlipidemia, unspecified: Secondary | ICD-10-CM

## 2019-01-04 DIAGNOSIS — E669 Obesity, unspecified: Secondary | ICD-10-CM

## 2019-01-04 DIAGNOSIS — J4520 Mild intermittent asthma, uncomplicated: Secondary | ICD-10-CM

## 2019-01-04 DIAGNOSIS — H269 Unspecified cataract: Secondary | ICD-10-CM

## 2019-01-04 DIAGNOSIS — E611 Iron deficiency: Secondary | ICD-10-CM

## 2019-01-06 DIAGNOSIS — Z1159 Encounter for screening for other viral diseases: Secondary | ICD-10-CM | POA: Insufficient documentation

## 2019-01-10 DIAGNOSIS — Z79899 Other long term (current) drug therapy: Secondary | ICD-10-CM | POA: Diagnosis not present

## 2019-01-10 DIAGNOSIS — Z9884 Bariatric surgery status: Secondary | ICD-10-CM | POA: Diagnosis not present

## 2019-01-10 DIAGNOSIS — E1136 Type 2 diabetes mellitus with diabetic cataract: Secondary | ICD-10-CM | POA: Diagnosis not present

## 2019-01-10 DIAGNOSIS — E039 Hypothyroidism, unspecified: Secondary | ICD-10-CM | POA: Diagnosis not present

## 2019-01-10 DIAGNOSIS — K219 Gastro-esophageal reflux disease without esophagitis: Secondary | ICD-10-CM | POA: Insufficient documentation

## 2019-01-10 DIAGNOSIS — Z79890 Hormone replacement therapy (postmenopausal): Secondary | ICD-10-CM | POA: Insufficient documentation

## 2019-01-10 DIAGNOSIS — I10 Essential (primary) hypertension: Secondary | ICD-10-CM | POA: Diagnosis not present

## 2019-01-10 DIAGNOSIS — H2511 Age-related nuclear cataract, right eye: Secondary | ICD-10-CM | POA: Diagnosis not present

## 2019-01-10 DIAGNOSIS — Z91040 Latex allergy status: Secondary | ICD-10-CM | POA: Diagnosis not present

## 2019-01-10 DIAGNOSIS — F17200 Nicotine dependence, unspecified, uncomplicated: Secondary | ICD-10-CM | POA: Insufficient documentation

## 2019-01-10 DIAGNOSIS — J45909 Unspecified asthma, uncomplicated: Secondary | ICD-10-CM | POA: Insufficient documentation

## 2019-01-10 DIAGNOSIS — E7800 Pure hypercholesterolemia, unspecified: Secondary | ICD-10-CM | POA: Diagnosis not present

## 2019-01-10 DIAGNOSIS — H25811 Combined forms of age-related cataract, right eye: Secondary | ICD-10-CM | POA: Diagnosis not present

## 2019-01-10 HISTORY — PX: CATARACT EXTRACTION W/PHACO: SHX586

## 2019-01-10 HISTORY — DX: Other complications of anesthesia, initial encounter: T88.59XA

## 2019-01-10 MED ADMIN — Fentanyl Citrate Preservative Free (PF) Inj 100 MCG/2ML: 50 ug | INTRAVENOUS | NDC 00409909422

## 2019-01-10 MED ADMIN — LIDOCAINE 2%/BSS OPTIME: 1 mL | NDC 63323049527

## 2019-01-10 MED ADMIN — Moxifloxacin HCl Ophth Soln 0.5% (Base Equiv): 1 [drp] | OPHTHALMIC | NDC 00065401303

## 2019-01-10 MED ADMIN — Midazolam HCl Inj 2 MG/2ML (Base Equivalent): 2 mg | INTRAVENOUS | NDC 63323041112

## 2019-01-10 MED ADMIN — EPINEPHRINE 1:1000 (1ML) AND BSS 500 ML OPTIME: 48 mL | OPHTHALMIC | NDC 00409724101

## 2019-01-10 MED ADMIN — Tropicamide-Cyclopen-PE-Keto Ophth Sol Pref Syr: 1 | OPHTHALMIC | NDC 99999080102

## 2019-01-10 MED ADMIN — Tetracaine HCl Ophth Soln 0.5%: 1 [drp] | OPHTHALMIC | NDC 00065074112

## 2019-01-10 MED ADMIN — Tetracaine HCl Ophth Soln 0.5%: 1 [drp] | OPHTHALMIC | NDC 00065074114

## 2019-01-10 MED ADMIN — Brimonidine Tartrate-Timolol Maleate Ophth Soln 0.2-0.5%: 1 [drp] | OPHTHALMIC | NDC 00023921105

## 2019-01-10 MED ADMIN — Na Hyaluron & Na Hyaluron-Na Chondroit Sul Op Kit 0.4-0.35ML: 1 mL | INTRAOCULAR | NDC 8065183135

## 2019-01-10 MED ADMIN — Moxifloxacin HCl Ophth Soln 0.5% (Base Equiv): 0.2 mL | OPHTHALMIC | NDC 00065401303

## 2019-01-20 DIAGNOSIS — Z1159 Encounter for screening for other viral diseases: Secondary | ICD-10-CM | POA: Insufficient documentation

## 2019-01-20 DIAGNOSIS — Z01812 Encounter for preprocedural laboratory examination: Secondary | ICD-10-CM | POA: Diagnosis not present

## 2019-01-25 DIAGNOSIS — Z79899 Other long term (current) drug therapy: Secondary | ICD-10-CM | POA: Diagnosis not present

## 2019-01-25 DIAGNOSIS — Z79890 Hormone replacement therapy (postmenopausal): Secondary | ICD-10-CM | POA: Insufficient documentation

## 2019-01-25 DIAGNOSIS — E1136 Type 2 diabetes mellitus with diabetic cataract: Secondary | ICD-10-CM | POA: Diagnosis not present

## 2019-01-25 DIAGNOSIS — K219 Gastro-esophageal reflux disease without esophagitis: Secondary | ICD-10-CM | POA: Diagnosis not present

## 2019-01-25 DIAGNOSIS — Z7984 Long term (current) use of oral hypoglycemic drugs: Secondary | ICD-10-CM | POA: Diagnosis not present

## 2019-01-25 DIAGNOSIS — F329 Major depressive disorder, single episode, unspecified: Secondary | ICD-10-CM | POA: Diagnosis not present

## 2019-01-25 DIAGNOSIS — E7800 Pure hypercholesterolemia, unspecified: Secondary | ICD-10-CM | POA: Insufficient documentation

## 2019-01-25 DIAGNOSIS — H2512 Age-related nuclear cataract, left eye: Secondary | ICD-10-CM | POA: Diagnosis not present

## 2019-01-25 DIAGNOSIS — J45909 Unspecified asthma, uncomplicated: Secondary | ICD-10-CM | POA: Insufficient documentation

## 2019-01-25 DIAGNOSIS — E039 Hypothyroidism, unspecified: Secondary | ICD-10-CM | POA: Insufficient documentation

## 2019-01-25 DIAGNOSIS — Z9884 Bariatric surgery status: Secondary | ICD-10-CM | POA: Insufficient documentation

## 2019-01-25 DIAGNOSIS — F17200 Nicotine dependence, unspecified, uncomplicated: Secondary | ICD-10-CM | POA: Diagnosis not present

## 2019-01-25 HISTORY — PX: CATARACT EXTRACTION W/PHACO: SHX586

## 2019-01-25 MED ADMIN — Fentanyl Citrate Preservative Free (PF) Inj 100 MCG/2ML: 50 ug | INTRAVENOUS | NDC 00409909422

## 2019-01-25 MED ADMIN — Tropicamide-Cyclopen-PE-Keto Ophth Sol Pref Syr: 1 | OPHTHALMIC | NDC 99999080102

## 2019-01-25 MED ADMIN — Moxifloxacin HCl Ophth Soln 0.5% (Base Equiv): 1 [drp] | OPHTHALMIC | @ 07:00:00 | NDC 00065401303

## 2019-01-25 MED ADMIN — Moxifloxacin HCl Ophth Soln 0.5% (Base Equiv): 1 [drp] | OPHTHALMIC | NDC 00065401303

## 2019-01-25 MED ADMIN — Na Hyaluron & Na Hyaluron-Na Chondroit Sul Op Kit 0.4-0.35ML: 1 mL | INTRAOCULAR | NDC 8065183135

## 2019-01-25 MED ADMIN — Tetracaine HCl Ophth Soln 0.5%: 1 [drp] | OPHTHALMIC | NDC 00065074114

## 2019-01-25 MED ADMIN — Tetracaine HCl Ophth Soln 0.5%: 1 [drp] | OPHTHALMIC | @ 07:00:00 | NDC 00065074114

## 2019-01-25 MED ADMIN — Cefuroxime Sodium For IV Soln 1.5 GM: 0.1 mL | INTRACAMERAL | NDC 99999050099

## 2019-01-25 MED ADMIN — Midazolam HCl Inj 2 MG/2ML (Base Equivalent): 2 mg | INTRAVENOUS | NDC 63323041112

## 2019-01-25 MED ADMIN — Brimonidine Tartrate-Timolol Maleate Ophth Soln 0.2-0.5%: 1 [drp] | OPHTHALMIC | NDC 00023921105

## 2019-01-25 MED ADMIN — LIDOCAINE 2%/BSS OPTIME: 1 mL | NDC 63323049527

## 2019-01-25 MED ADMIN — EPINEPHRINE 1:1000 (1ML) AND BSS 500 ML OPTIME: 68 mL | OPHTHALMIC | NDC 00409724101

## 2019-03-23 DIAGNOSIS — F17200 Nicotine dependence, unspecified, uncomplicated: Secondary | ICD-10-CM

## 2019-03-23 DIAGNOSIS — M5416 Radiculopathy, lumbar region: Secondary | ICD-10-CM

## 2019-04-19 DIAGNOSIS — G4700 Insomnia, unspecified: Secondary | ICD-10-CM

## 2019-05-01 DIAGNOSIS — Z20822 Contact with and (suspected) exposure to covid-19: Secondary | ICD-10-CM

## 2019-05-24 DIAGNOSIS — G4700 Insomnia, unspecified: Secondary | ICD-10-CM

## 2019-05-25 DIAGNOSIS — F17200 Nicotine dependence, unspecified, uncomplicated: Secondary | ICD-10-CM | POA: Diagnosis not present

## 2019-05-25 DIAGNOSIS — E039 Hypothyroidism, unspecified: Secondary | ICD-10-CM

## 2019-05-25 DIAGNOSIS — I10 Essential (primary) hypertension: Secondary | ICD-10-CM

## 2019-05-25 DIAGNOSIS — G4700 Insomnia, unspecified: Secondary | ICD-10-CM | POA: Diagnosis not present

## 2019-05-25 DIAGNOSIS — M5442 Lumbago with sciatica, left side: Secondary | ICD-10-CM

## 2019-05-25 DIAGNOSIS — F419 Anxiety disorder, unspecified: Secondary | ICD-10-CM

## 2019-05-25 DIAGNOSIS — E785 Hyperlipidemia, unspecified: Secondary | ICD-10-CM

## 2019-05-25 DIAGNOSIS — M5441 Lumbago with sciatica, right side: Secondary | ICD-10-CM

## 2019-05-25 DIAGNOSIS — R112 Nausea with vomiting, unspecified: Secondary | ICD-10-CM

## 2019-05-25 DIAGNOSIS — K311 Adult hypertrophic pyloric stenosis: Secondary | ICD-10-CM

## 2019-05-25 DIAGNOSIS — Z720 Tobacco use: Secondary | ICD-10-CM

## 2019-05-25 DIAGNOSIS — R1310 Dysphagia, unspecified: Secondary | ICD-10-CM | POA: Diagnosis not present

## 2019-05-25 DIAGNOSIS — F329 Major depressive disorder, single episode, unspecified: Secondary | ICD-10-CM

## 2019-05-25 DIAGNOSIS — M5416 Radiculopathy, lumbar region: Secondary | ICD-10-CM

## 2019-05-25 DIAGNOSIS — J4520 Mild intermittent asthma, uncomplicated: Secondary | ICD-10-CM

## 2019-05-25 DIAGNOSIS — F32A Depression, unspecified: Secondary | ICD-10-CM

## 2019-05-25 DIAGNOSIS — D62 Acute posthemorrhagic anemia: Secondary | ICD-10-CM

## 2019-05-25 DIAGNOSIS — Z111 Encounter for screening for respiratory tuberculosis: Secondary | ICD-10-CM

## 2019-05-25 DIAGNOSIS — E119 Type 2 diabetes mellitus without complications: Secondary | ICD-10-CM

## 2019-05-29 DIAGNOSIS — Z23 Encounter for immunization: Secondary | ICD-10-CM | POA: Diagnosis not present

## 2019-07-04 DIAGNOSIS — J4520 Mild intermittent asthma, uncomplicated: Secondary | ICD-10-CM

## 2019-07-04 DIAGNOSIS — F17200 Nicotine dependence, unspecified, uncomplicated: Secondary | ICD-10-CM

## 2019-08-15 DIAGNOSIS — Z111 Encounter for screening for respiratory tuberculosis: Secondary | ICD-10-CM

## 2019-08-16 DIAGNOSIS — Z20822 Contact with and (suspected) exposure to covid-19: Secondary | ICD-10-CM

## 2019-08-18 DIAGNOSIS — Z111 Encounter for screening for respiratory tuberculosis: Secondary | ICD-10-CM

## 2019-10-24 DIAGNOSIS — G4700 Insomnia, unspecified: Secondary | ICD-10-CM

## 2019-12-06 DIAGNOSIS — E611 Iron deficiency: Secondary | ICD-10-CM

## 2019-12-08 DIAGNOSIS — F329 Major depressive disorder, single episode, unspecified: Secondary | ICD-10-CM

## 2019-12-08 DIAGNOSIS — F32A Depression, unspecified: Secondary | ICD-10-CM

## 2020-04-26 DIAGNOSIS — G4700 Insomnia, unspecified: Secondary | ICD-10-CM

## 2020-05-13 DIAGNOSIS — G4700 Insomnia, unspecified: Secondary | ICD-10-CM

## 2020-05-15 DIAGNOSIS — F329 Major depressive disorder, single episode, unspecified: Secondary | ICD-10-CM

## 2020-05-15 DIAGNOSIS — F17200 Nicotine dependence, unspecified, uncomplicated: Secondary | ICD-10-CM

## 2020-05-15 DIAGNOSIS — F339 Major depressive disorder, recurrent, unspecified: Secondary | ICD-10-CM

## 2020-05-15 DIAGNOSIS — Z23 Encounter for immunization: Secondary | ICD-10-CM

## 2020-05-15 DIAGNOSIS — K311 Adult hypertrophic pyloric stenosis: Secondary | ICD-10-CM | POA: Diagnosis not present

## 2020-05-15 DIAGNOSIS — E559 Vitamin D deficiency, unspecified: Secondary | ICD-10-CM | POA: Diagnosis not present

## 2020-05-15 DIAGNOSIS — F419 Anxiety disorder, unspecified: Secondary | ICD-10-CM

## 2020-05-15 DIAGNOSIS — M545 Low back pain, unspecified: Secondary | ICD-10-CM

## 2020-05-15 DIAGNOSIS — M5416 Radiculopathy, lumbar region: Secondary | ICD-10-CM

## 2020-05-15 DIAGNOSIS — R69 Illness, unspecified: Secondary | ICD-10-CM

## 2020-05-15 DIAGNOSIS — R112 Nausea with vomiting, unspecified: Secondary | ICD-10-CM

## 2020-05-15 DIAGNOSIS — E538 Deficiency of other specified B group vitamins: Secondary | ICD-10-CM

## 2020-05-15 DIAGNOSIS — J4520 Mild intermittent asthma, uncomplicated: Secondary | ICD-10-CM

## 2020-05-15 DIAGNOSIS — D62 Acute posthemorrhagic anemia: Secondary | ICD-10-CM

## 2020-05-15 DIAGNOSIS — E785 Hyperlipidemia, unspecified: Secondary | ICD-10-CM

## 2020-05-15 DIAGNOSIS — E669 Obesity, unspecified: Secondary | ICD-10-CM

## 2020-05-15 DIAGNOSIS — F4321 Adjustment disorder with depressed mood: Secondary | ICD-10-CM

## 2020-05-15 DIAGNOSIS — E162 Hypoglycemia, unspecified: Secondary | ICD-10-CM

## 2020-05-15 DIAGNOSIS — R519 Headache, unspecified: Secondary | ICD-10-CM

## 2020-05-15 DIAGNOSIS — E119 Type 2 diabetes mellitus without complications: Secondary | ICD-10-CM

## 2020-05-15 DIAGNOSIS — G8929 Other chronic pain: Secondary | ICD-10-CM

## 2020-05-15 DIAGNOSIS — I152 Hypertension secondary to endocrine disorders: Secondary | ICD-10-CM

## 2020-05-15 DIAGNOSIS — Z1329 Encounter for screening for other suspected endocrine disorder: Secondary | ICD-10-CM

## 2020-05-15 DIAGNOSIS — E039 Hypothyroidism, unspecified: Secondary | ICD-10-CM | POA: Diagnosis not present

## 2020-05-15 DIAGNOSIS — E1159 Type 2 diabetes mellitus with other circulatory complications: Secondary | ICD-10-CM | POA: Diagnosis not present

## 2020-05-15 DIAGNOSIS — Z1231 Encounter for screening mammogram for malignant neoplasm of breast: Secondary | ICD-10-CM

## 2020-05-15 DIAGNOSIS — I10 Essential (primary) hypertension: Secondary | ICD-10-CM | POA: Diagnosis not present

## 2020-05-15 DIAGNOSIS — E611 Iron deficiency: Secondary | ICD-10-CM

## 2020-05-15 DIAGNOSIS — F32A Depression, unspecified: Secondary | ICD-10-CM

## 2020-05-20 MED ORDER — VALACYCLOVIR HCL 1 G PO TABS
1000.0000 mg | ORAL_TABLET | Freq: Two times a day (BID) | ORAL | 11 refills | Status: DC
Start: 2020-05-20 — End: 2021-02-27

## 2020-05-22 DIAGNOSIS — E1159 Type 2 diabetes mellitus with other circulatory complications: Secondary | ICD-10-CM | POA: Insufficient documentation

## 2020-05-22 DIAGNOSIS — R69 Illness, unspecified: Secondary | ICD-10-CM | POA: Insufficient documentation

## 2020-05-22 DIAGNOSIS — I152 Hypertension secondary to endocrine disorders: Secondary | ICD-10-CM | POA: Insufficient documentation

## 2020-05-22 DIAGNOSIS — F339 Major depressive disorder, recurrent, unspecified: Secondary | ICD-10-CM | POA: Insufficient documentation

## 2020-05-22 HISTORY — DX: Illness, unspecified: R69

## 2020-08-01 DIAGNOSIS — G4700 Insomnia, unspecified: Secondary | ICD-10-CM

## 2020-08-09 DIAGNOSIS — G4700 Insomnia, unspecified: Secondary | ICD-10-CM

## 2020-08-16 DIAGNOSIS — F17200 Nicotine dependence, unspecified, uncomplicated: Secondary | ICD-10-CM

## 2020-08-16 DIAGNOSIS — G4700 Insomnia, unspecified: Secondary | ICD-10-CM

## 2020-10-25 DIAGNOSIS — Z0289 Encounter for other administrative examinations: Secondary | ICD-10-CM

## 2021-02-16 DIAGNOSIS — G4700 Insomnia, unspecified: Secondary | ICD-10-CM

## 2021-02-17 DIAGNOSIS — G4700 Insomnia, unspecified: Secondary | ICD-10-CM

## 2021-02-27 DIAGNOSIS — E039 Hypothyroidism, unspecified: Secondary | ICD-10-CM

## 2021-02-27 DIAGNOSIS — I152 Hypertension secondary to endocrine disorders: Secondary | ICD-10-CM | POA: Diagnosis not present

## 2021-02-27 DIAGNOSIS — E1159 Type 2 diabetes mellitus with other circulatory complications: Secondary | ICD-10-CM | POA: Diagnosis not present

## 2021-02-27 DIAGNOSIS — G4700 Insomnia, unspecified: Secondary | ICD-10-CM | POA: Diagnosis not present

## 2021-02-27 DIAGNOSIS — M5416 Radiculopathy, lumbar region: Secondary | ICD-10-CM

## 2021-02-27 DIAGNOSIS — E119 Type 2 diabetes mellitus without complications: Secondary | ICD-10-CM

## 2021-02-27 DIAGNOSIS — Z1231 Encounter for screening mammogram for malignant neoplasm of breast: Secondary | ICD-10-CM

## 2021-02-27 DIAGNOSIS — I10 Essential (primary) hypertension: Secondary | ICD-10-CM

## 2021-02-27 DIAGNOSIS — E538 Deficiency of other specified B group vitamins: Secondary | ICD-10-CM | POA: Diagnosis not present

## 2021-02-27 DIAGNOSIS — E785 Hyperlipidemia, unspecified: Secondary | ICD-10-CM

## 2021-02-27 DIAGNOSIS — E611 Iron deficiency: Secondary | ICD-10-CM

## 2021-02-27 DIAGNOSIS — F419 Anxiety disorder, unspecified: Secondary | ICD-10-CM

## 2021-02-27 DIAGNOSIS — G4733 Obstructive sleep apnea (adult) (pediatric): Secondary | ICD-10-CM

## 2021-02-27 DIAGNOSIS — E559 Vitamin D deficiency, unspecified: Secondary | ICD-10-CM

## 2021-02-27 DIAGNOSIS — F17200 Nicotine dependence, unspecified, uncomplicated: Secondary | ICD-10-CM

## 2021-02-27 DIAGNOSIS — J4520 Mild intermittent asthma, uncomplicated: Secondary | ICD-10-CM

## 2021-02-27 DIAGNOSIS — F32A Depression, unspecified: Secondary | ICD-10-CM

## 2021-02-27 DIAGNOSIS — K311 Adult hypertrophic pyloric stenosis: Secondary | ICD-10-CM

## 2021-02-27 DIAGNOSIS — R112 Nausea with vomiting, unspecified: Secondary | ICD-10-CM

## 2021-02-27 DIAGNOSIS — K3184 Gastroparesis: Secondary | ICD-10-CM

## 2021-03-02 DIAGNOSIS — E162 Hypoglycemia, unspecified: Secondary | ICD-10-CM

## 2021-03-02 DIAGNOSIS — I152 Hypertension secondary to endocrine disorders: Secondary | ICD-10-CM

## 2021-05-02 DIAGNOSIS — K311 Adult hypertrophic pyloric stenosis: Secondary | ICD-10-CM | POA: Diagnosis not present

## 2021-05-02 DIAGNOSIS — K5900 Constipation, unspecified: Secondary | ICD-10-CM | POA: Diagnosis not present

## 2021-05-02 DIAGNOSIS — R7989 Other specified abnormal findings of blood chemistry: Secondary | ICD-10-CM | POA: Diagnosis not present

## 2021-05-07 DIAGNOSIS — K311 Adult hypertrophic pyloric stenosis: Secondary | ICD-10-CM

## 2021-05-08 DIAGNOSIS — R7989 Other specified abnormal findings of blood chemistry: Secondary | ICD-10-CM

## 2021-05-08 DIAGNOSIS — R748 Abnormal levels of other serum enzymes: Secondary | ICD-10-CM

## 2021-05-17 DIAGNOSIS — M5416 Radiculopathy, lumbar region: Secondary | ICD-10-CM

## 2021-05-21 DIAGNOSIS — G4719 Other hypersomnia: Secondary | ICD-10-CM

## 2021-05-30 DIAGNOSIS — K311 Adult hypertrophic pyloric stenosis: Secondary | ICD-10-CM

## 2021-06-06 DIAGNOSIS — E1142 Type 2 diabetes mellitus with diabetic polyneuropathy: Secondary | ICD-10-CM

## 2021-06-06 DIAGNOSIS — Z9884 Bariatric surgery status: Secondary | ICD-10-CM

## 2021-06-06 DIAGNOSIS — R739 Hyperglycemia, unspecified: Secondary | ICD-10-CM

## 2021-06-06 DIAGNOSIS — E890 Postprocedural hypothyroidism: Secondary | ICD-10-CM

## 2021-06-06 DIAGNOSIS — R748 Abnormal levels of other serum enzymes: Secondary | ICD-10-CM

## 2021-06-06 DIAGNOSIS — Z8639 Personal history of other endocrine, nutritional and metabolic disease: Secondary | ICD-10-CM

## 2021-06-06 DIAGNOSIS — E118 Type 2 diabetes mellitus with unspecified complications: Secondary | ICD-10-CM | POA: Insufficient documentation

## 2021-06-06 LAB — MICROALBUMIN / CREATININE URINE RATIO
Creatinine,U: 107.1 mg/dL
Microalb Creat Ratio: 0.8 mg/g (ref 0.0–30.0)
Microalb, Ur: 0.8 mg/dL (ref 0.0–1.9)

## 2021-06-16 DIAGNOSIS — Z800 Family history of malignant neoplasm of digestive organs: Secondary | ICD-10-CM | POA: Diagnosis not present

## 2021-06-16 DIAGNOSIS — F17210 Nicotine dependence, cigarettes, uncomplicated: Secondary | ICD-10-CM | POA: Insufficient documentation

## 2021-06-16 DIAGNOSIS — Z841 Family history of disorders of kidney and ureter: Secondary | ICD-10-CM | POA: Diagnosis not present

## 2021-06-16 DIAGNOSIS — Z91040 Latex allergy status: Secondary | ICD-10-CM | POA: Insufficient documentation

## 2021-06-16 DIAGNOSIS — K285 Chronic or unspecified gastrojejunal ulcer with perforation: Secondary | ICD-10-CM | POA: Diagnosis not present

## 2021-06-16 DIAGNOSIS — Z8249 Family history of ischemic heart disease and other diseases of the circulatory system: Secondary | ICD-10-CM | POA: Diagnosis not present

## 2021-06-16 DIAGNOSIS — Z9884 Bariatric surgery status: Secondary | ICD-10-CM | POA: Insufficient documentation

## 2021-06-16 DIAGNOSIS — K219 Gastro-esophageal reflux disease without esophagitis: Secondary | ICD-10-CM | POA: Insufficient documentation

## 2021-06-16 DIAGNOSIS — Z825 Family history of asthma and other chronic lower respiratory diseases: Secondary | ICD-10-CM | POA: Diagnosis not present

## 2021-06-16 DIAGNOSIS — Z8261 Family history of arthritis: Secondary | ICD-10-CM | POA: Insufficient documentation

## 2021-06-16 DIAGNOSIS — Z8371 Family history of colonic polyps: Secondary | ICD-10-CM | POA: Diagnosis not present

## 2021-06-16 DIAGNOSIS — K9131 Postprocedural partial intestinal obstruction: Secondary | ICD-10-CM | POA: Diagnosis not present

## 2021-06-16 DIAGNOSIS — Z833 Family history of diabetes mellitus: Secondary | ICD-10-CM | POA: Diagnosis not present

## 2021-06-16 DIAGNOSIS — Z8349 Family history of other endocrine, nutritional and metabolic diseases: Secondary | ICD-10-CM | POA: Insufficient documentation

## 2021-06-16 DIAGNOSIS — Z888 Allergy status to other drugs, medicaments and biological substances status: Secondary | ICD-10-CM | POA: Diagnosis not present

## 2021-06-16 DIAGNOSIS — Z91048 Other nonmedicinal substance allergy status: Secondary | ICD-10-CM | POA: Insufficient documentation

## 2021-06-16 DIAGNOSIS — K311 Adult hypertrophic pyloric stenosis: Secondary | ICD-10-CM

## 2021-06-16 DIAGNOSIS — Z8711 Personal history of peptic ulcer disease: Secondary | ICD-10-CM | POA: Insufficient documentation

## 2021-06-16 DIAGNOSIS — K319 Disease of stomach and duodenum, unspecified: Secondary | ICD-10-CM | POA: Insufficient documentation

## 2021-06-16 HISTORY — PX: BALLOON DILATION: SHX5330

## 2021-06-16 HISTORY — PX: ESOPHAGOGASTRODUODENOSCOPY (EGD) WITH PROPOFOL: SHX5813

## 2021-06-16 HISTORY — PX: BIOPSY: SHX5522

## 2021-06-16 MED ADMIN — Lactated Ringer's Solution: 1000 mL | INTRAVENOUS | NDC 00338011704

## 2021-06-16 MED ADMIN — Lidocaine HCl Local Soln Prefilled Syringe 100 MG/5ML (2%): 40 mg | INTRAVENOUS | NDC 69374094905

## 2021-06-16 MED ADMIN — Propofol IV Emul 500 MG/50ML (10 MG/ML): 150 ug/kg/min | INTRAVENOUS | NDC 00409469933

## 2021-06-16 MED ADMIN — PROPOFOL 200 MG/20ML IV EMUL: 20 mg | INTRAVENOUS | NDC 00069020910

## 2021-06-19 DIAGNOSIS — K311 Adult hypertrophic pyloric stenosis: Secondary | ICD-10-CM

## 2021-07-03 DIAGNOSIS — G4733 Obstructive sleep apnea (adult) (pediatric): Secondary | ICD-10-CM | POA: Diagnosis not present

## 2021-07-03 DIAGNOSIS — G4719 Other hypersomnia: Secondary | ICD-10-CM

## 2021-07-04 DIAGNOSIS — G4733 Obstructive sleep apnea (adult) (pediatric): Secondary | ICD-10-CM | POA: Diagnosis not present

## 2021-07-10 DIAGNOSIS — G4733 Obstructive sleep apnea (adult) (pediatric): Secondary | ICD-10-CM

## 2021-07-11 DIAGNOSIS — M899 Disorder of bone, unspecified: Secondary | ICD-10-CM

## 2021-07-11 DIAGNOSIS — M25561 Pain in right knee: Secondary | ICD-10-CM | POA: Diagnosis not present

## 2021-07-11 DIAGNOSIS — Z9989 Dependence on other enabling machines and devices: Secondary | ICD-10-CM

## 2021-07-11 DIAGNOSIS — I152 Hypertension secondary to endocrine disorders: Secondary | ICD-10-CM

## 2021-07-11 DIAGNOSIS — R748 Abnormal levels of other serum enzymes: Secondary | ICD-10-CM | POA: Insufficient documentation

## 2021-07-11 DIAGNOSIS — M549 Dorsalgia, unspecified: Secondary | ICD-10-CM

## 2021-07-11 DIAGNOSIS — E669 Obesity, unspecified: Secondary | ICD-10-CM | POA: Diagnosis not present

## 2021-07-11 DIAGNOSIS — M898X9 Other specified disorders of bone, unspecified site: Secondary | ICD-10-CM

## 2021-07-11 DIAGNOSIS — Z23 Encounter for immunization: Secondary | ICD-10-CM

## 2021-07-11 DIAGNOSIS — E1159 Type 2 diabetes mellitus with other circulatory complications: Secondary | ICD-10-CM

## 2021-07-11 DIAGNOSIS — G4700 Insomnia, unspecified: Secondary | ICD-10-CM

## 2021-07-11 DIAGNOSIS — M5416 Radiculopathy, lumbar region: Secondary | ICD-10-CM

## 2021-07-11 DIAGNOSIS — G4733 Obstructive sleep apnea (adult) (pediatric): Secondary | ICD-10-CM

## 2021-07-11 HISTORY — DX: Abnormal levels of other serum enzymes: R74.8

## 2021-07-11 MED ORDER — PHENTERMINE HCL 37.5 MG PO TABS
37.5000 mg | ORAL_TABLET | Freq: Every day | ORAL | 0 refills | Status: DC
Start: 2021-07-11 — End: 2021-07-21

## 2021-07-21 DIAGNOSIS — Z7984 Long term (current) use of oral hypoglycemic drugs: Secondary | ICD-10-CM | POA: Insufficient documentation

## 2021-07-21 DIAGNOSIS — Z980 Intestinal bypass and anastomosis status: Secondary | ICD-10-CM | POA: Insufficient documentation

## 2021-07-21 DIAGNOSIS — K222 Esophageal obstruction: Secondary | ICD-10-CM | POA: Insufficient documentation

## 2021-07-21 DIAGNOSIS — R6881 Early satiety: Secondary | ICD-10-CM | POA: Diagnosis not present

## 2021-07-21 DIAGNOSIS — E119 Type 2 diabetes mellitus without complications: Secondary | ICD-10-CM | POA: Insufficient documentation

## 2021-07-21 DIAGNOSIS — K219 Gastro-esophageal reflux disease without esophagitis: Secondary | ICD-10-CM | POA: Diagnosis not present

## 2021-07-21 DIAGNOSIS — J45909 Unspecified asthma, uncomplicated: Secondary | ICD-10-CM | POA: Insufficient documentation

## 2021-07-21 DIAGNOSIS — Z79899 Other long term (current) drug therapy: Secondary | ICD-10-CM | POA: Diagnosis not present

## 2021-07-21 DIAGNOSIS — K2289 Other specified disease of esophagus: Secondary | ICD-10-CM | POA: Insufficient documentation

## 2021-07-21 DIAGNOSIS — K311 Adult hypertrophic pyloric stenosis: Secondary | ICD-10-CM

## 2021-07-21 DIAGNOSIS — G4730 Sleep apnea, unspecified: Secondary | ICD-10-CM | POA: Diagnosis not present

## 2021-07-21 DIAGNOSIS — E039 Hypothyroidism, unspecified: Secondary | ICD-10-CM | POA: Diagnosis not present

## 2021-07-21 DIAGNOSIS — I10 Essential (primary) hypertension: Secondary | ICD-10-CM | POA: Diagnosis not present

## 2021-07-21 HISTORY — PX: ESOPHAGOGASTRODUODENOSCOPY (EGD) WITH PROPOFOL: SHX5813

## 2021-07-21 HISTORY — PX: SCLEROTHERAPY: SHX6841

## 2021-07-21 HISTORY — PX: BALLOON DILATION: SHX5330

## 2021-07-21 MED ADMIN — PROPOFOL 200 MG/20ML IV EMUL: 10 mg | INTRAVENOUS | NDC 00069020910

## 2021-07-21 MED ADMIN — PROPOFOL 200 MG/20ML IV EMUL: 20 mg | INTRAVENOUS | NDC 00069020910

## 2021-07-21 MED ADMIN — Triamcinolone Acetonide Inj Susp 40 MG/ML: 40 mg | INTRAMUSCULAR | NDC 00003029305

## 2021-07-21 MED ADMIN — Propofol IV Emul 500 MG/50ML (10 MG/ML): 150 ug/kg/min | INTRAVENOUS | NDC 00409469933

## 2021-07-21 MED ADMIN — Lidocaine HCl Local Soln Prefilled Syringe 100 MG/5ML (2%): 100 mg | INTRAVENOUS | NDC 69374094905

## 2021-08-31 HISTORY — PX: REDUCTION MAMMAPLASTY: SUR839

## 2021-09-10 DIAGNOSIS — R739 Hyperglycemia, unspecified: Secondary | ICD-10-CM

## 2021-09-10 DIAGNOSIS — E876 Hypokalemia: Secondary | ICD-10-CM

## 2021-09-10 DIAGNOSIS — R232 Flushing: Secondary | ICD-10-CM

## 2021-09-10 DIAGNOSIS — E1142 Type 2 diabetes mellitus with diabetic polyneuropathy: Secondary | ICD-10-CM

## 2021-09-10 DIAGNOSIS — E890 Postprocedural hypothyroidism: Secondary | ICD-10-CM | POA: Diagnosis not present

## 2021-09-10 DIAGNOSIS — R748 Abnormal levels of other serum enzymes: Secondary | ICD-10-CM | POA: Diagnosis not present

## 2021-09-10 DIAGNOSIS — Z9884 Bariatric surgery status: Secondary | ICD-10-CM | POA: Diagnosis not present

## 2021-09-11 MED ORDER — POTASSIUM CHLORIDE CRYS ER 20 MEQ PO TBCR
20.0000 meq | EXTENDED_RELEASE_TABLET | Freq: Every day | ORAL | 1 refills | Status: DC
Start: 2021-09-11 — End: 2022-07-28

## 2021-09-12 DIAGNOSIS — F17200 Nicotine dependence, unspecified, uncomplicated: Secondary | ICD-10-CM

## 2021-09-14 DIAGNOSIS — M5416 Radiculopathy, lumbar region: Secondary | ICD-10-CM | POA: Diagnosis present

## 2021-09-16 DIAGNOSIS — R748 Abnormal levels of other serum enzymes: Secondary | ICD-10-CM | POA: Insufficient documentation

## 2021-09-16 DIAGNOSIS — M898X9 Other specified disorders of bone, unspecified site: Secondary | ICD-10-CM | POA: Insufficient documentation

## 2021-09-16 DIAGNOSIS — E876 Hypokalemia: Secondary | ICD-10-CM

## 2021-09-16 MED ADMIN — Technetium Tc 99m Medronate IV for Soln Kit: 21.1 | INTRAVENOUS | NDC 17156043804

## 2021-09-17 DIAGNOSIS — S22059S Unspecified fracture of T5-T6 vertebra, sequela: Secondary | ICD-10-CM

## 2021-09-17 DIAGNOSIS — M1990 Unspecified osteoarthritis, unspecified site: Secondary | ICD-10-CM | POA: Insufficient documentation

## 2021-09-17 DIAGNOSIS — M899 Disorder of bone, unspecified: Secondary | ICD-10-CM

## 2021-09-23 DIAGNOSIS — M899 Disorder of bone, unspecified: Secondary | ICD-10-CM

## 2021-09-23 DIAGNOSIS — S22059S Unspecified fracture of T5-T6 vertebra, sequela: Secondary | ICD-10-CM | POA: Diagnosis not present

## 2021-09-29 DIAGNOSIS — Z1231 Encounter for screening mammogram for malignant neoplasm of breast: Secondary | ICD-10-CM | POA: Insufficient documentation

## 2021-10-03 DIAGNOSIS — M899 Disorder of bone, unspecified: Secondary | ICD-10-CM | POA: Diagnosis not present

## 2021-10-03 DIAGNOSIS — R922 Inconclusive mammogram: Secondary | ICD-10-CM | POA: Diagnosis not present

## 2021-10-03 DIAGNOSIS — N62 Hypertrophy of breast: Secondary | ICD-10-CM

## 2021-10-03 DIAGNOSIS — M549 Dorsalgia, unspecified: Secondary | ICD-10-CM

## 2021-10-03 DIAGNOSIS — R9230 Dense breasts, unspecified: Secondary | ICD-10-CM

## 2021-10-03 DIAGNOSIS — Z23 Encounter for immunization: Secondary | ICD-10-CM | POA: Diagnosis not present

## 2021-10-09 DIAGNOSIS — M545 Low back pain, unspecified: Secondary | ICD-10-CM | POA: Diagnosis not present

## 2021-10-09 DIAGNOSIS — M793 Panniculitis, unspecified: Secondary | ICD-10-CM

## 2021-10-09 DIAGNOSIS — M4004 Postural kyphosis, thoracic region: Secondary | ICD-10-CM | POA: Diagnosis not present

## 2021-10-09 DIAGNOSIS — N62 Hypertrophy of breast: Secondary | ICD-10-CM | POA: Diagnosis not present

## 2021-10-09 DIAGNOSIS — M546 Pain in thoracic spine: Secondary | ICD-10-CM

## 2021-10-12 DIAGNOSIS — M549 Dorsalgia, unspecified: Secondary | ICD-10-CM | POA: Insufficient documentation

## 2021-10-12 DIAGNOSIS — M899 Disorder of bone, unspecified: Secondary | ICD-10-CM | POA: Diagnosis present

## 2021-10-15 DIAGNOSIS — Z86010 Personal history of colonic polyps: Secondary | ICD-10-CM

## 2021-10-15 DIAGNOSIS — K311 Adult hypertrophic pyloric stenosis: Secondary | ICD-10-CM

## 2021-10-15 DIAGNOSIS — R140 Abdominal distension (gaseous): Secondary | ICD-10-CM

## 2021-10-15 DIAGNOSIS — R110 Nausea: Secondary | ICD-10-CM

## 2021-10-15 DIAGNOSIS — R748 Abnormal levels of other serum enzymes: Secondary | ICD-10-CM

## 2021-10-15 DIAGNOSIS — K9189 Other postprocedural complications and disorders of digestive system: Secondary | ICD-10-CM | POA: Diagnosis not present

## 2021-10-17 DIAGNOSIS — R140 Abdominal distension (gaseous): Secondary | ICD-10-CM

## 2021-10-17 DIAGNOSIS — R110 Nausea: Secondary | ICD-10-CM | POA: Insufficient documentation

## 2021-10-17 DIAGNOSIS — Z86010 Personal history of colonic polyps: Secondary | ICD-10-CM

## 2021-10-17 DIAGNOSIS — K9189 Other postprocedural complications and disorders of digestive system: Secondary | ICD-10-CM | POA: Insufficient documentation

## 2021-10-17 DIAGNOSIS — Z860101 Personal history of adenomatous and serrated colon polyps: Secondary | ICD-10-CM

## 2021-10-17 HISTORY — DX: Personal history of colonic polyps: Z86.010

## 2021-10-17 HISTORY — DX: Nausea: R11.0

## 2021-10-17 HISTORY — DX: Abdominal distension (gaseous): R14.0

## 2021-10-17 HISTORY — DX: Personal history of adenomatous and serrated colon polyps: Z86.0101

## 2021-10-20 DIAGNOSIS — F17200 Nicotine dependence, unspecified, uncomplicated: Secondary | ICD-10-CM

## 2021-11-13 DIAGNOSIS — Z23 Encounter for immunization: Secondary | ICD-10-CM

## 2021-11-13 DIAGNOSIS — E669 Obesity, unspecified: Secondary | ICD-10-CM | POA: Diagnosis not present

## 2021-11-13 DIAGNOSIS — M899 Disorder of bone, unspecified: Secondary | ICD-10-CM

## 2021-11-13 DIAGNOSIS — T753XXA Motion sickness, initial encounter: Secondary | ICD-10-CM | POA: Diagnosis not present

## 2021-11-13 DIAGNOSIS — R937 Abnormal findings on diagnostic imaging of other parts of musculoskeletal system: Secondary | ICD-10-CM | POA: Diagnosis not present

## 2021-11-13 DIAGNOSIS — M5416 Radiculopathy, lumbar region: Secondary | ICD-10-CM

## 2021-11-13 HISTORY — DX: Abnormal findings on diagnostic imaging of other parts of musculoskeletal system: R93.7

## 2021-11-19 DIAGNOSIS — K9189 Other postprocedural complications and disorders of digestive system: Secondary | ICD-10-CM

## 2021-11-19 DIAGNOSIS — K2289 Other specified disease of esophagus: Secondary | ICD-10-CM

## 2021-11-19 DIAGNOSIS — Z9884 Bariatric surgery status: Secondary | ICD-10-CM | POA: Diagnosis not present

## 2021-11-19 DIAGNOSIS — K219 Gastro-esophageal reflux disease without esophagitis: Secondary | ICD-10-CM | POA: Diagnosis not present

## 2021-11-19 DIAGNOSIS — J45909 Unspecified asthma, uncomplicated: Secondary | ICD-10-CM | POA: Insufficient documentation

## 2021-11-19 DIAGNOSIS — E039 Hypothyroidism, unspecified: Secondary | ICD-10-CM | POA: Insufficient documentation

## 2021-11-19 DIAGNOSIS — R112 Nausea with vomiting, unspecified: Secondary | ICD-10-CM | POA: Insufficient documentation

## 2021-11-19 DIAGNOSIS — Z7984 Long term (current) use of oral hypoglycemic drugs: Secondary | ICD-10-CM | POA: Insufficient documentation

## 2021-11-19 DIAGNOSIS — K9589 Other complications of other bariatric procedure: Secondary | ICD-10-CM

## 2021-11-19 DIAGNOSIS — F17210 Nicotine dependence, cigarettes, uncomplicated: Secondary | ICD-10-CM | POA: Insufficient documentation

## 2021-11-19 DIAGNOSIS — I10 Essential (primary) hypertension: Secondary | ICD-10-CM | POA: Diagnosis not present

## 2021-11-19 DIAGNOSIS — R140 Abdominal distension (gaseous): Secondary | ICD-10-CM | POA: Diagnosis present

## 2021-11-19 DIAGNOSIS — K449 Diaphragmatic hernia without obstruction or gangrene: Secondary | ICD-10-CM | POA: Insufficient documentation

## 2021-11-19 DIAGNOSIS — E119 Type 2 diabetes mellitus without complications: Secondary | ICD-10-CM | POA: Diagnosis not present

## 2021-11-19 DIAGNOSIS — R110 Nausea: Secondary | ICD-10-CM

## 2021-11-19 DIAGNOSIS — R6881 Early satiety: Secondary | ICD-10-CM | POA: Insufficient documentation

## 2021-11-19 DIAGNOSIS — Z980 Intestinal bypass and anastomosis status: Secondary | ICD-10-CM

## 2021-11-19 HISTORY — PX: SUBMUCOSAL INJECTION: SHX5543

## 2021-11-19 HISTORY — PX: ESOPHAGOGASTRODUODENOSCOPY (EGD) WITH PROPOFOL: SHX5813

## 2021-11-19 HISTORY — PX: ESOPHAGEAL STENT PLACEMENT: SHX5540

## 2021-11-19 MED ADMIN — Glycopyrrolate Inj 0.2 MG/ML: .1 mg | INTRAVENOUS | NDC 72572022525

## 2021-11-19 MED ADMIN — Dexmedetomidine HCl-NaCl Soln Pref Syr 20 MCG/5ML-0.9%: 4 ug | INTRAVENOUS | NDC 70092160044

## 2021-11-19 MED ADMIN — PROPOFOL 200 MG/20ML IV EMUL: 30 mg | INTRAVENOUS | NDC 00069020910

## 2021-11-19 MED ADMIN — Ondansetron HCl Inj 4 MG/2ML (2 MG/ML): 4 mg | INTRAVENOUS | NDC 00409475503

## 2021-11-19 MED ADMIN — Lidocaine HCl(Cardiac) IV PF Soln Pref Syr 100 MG/5ML (2%): 60 mg | INTRAVENOUS | NDC 00409132305

## 2021-11-19 MED ADMIN — Propofol IV Emul 500 MG/50ML (10 MG/ML): 150 ug/kg/min | INTRAVENOUS | NDC 00069023420

## 2021-11-19 MED ADMIN — BUPIVACAINE-TRIAMCINOLONE INJECTION: 4 mL | SUBCUTANEOUS | NDC 63323046617

## 2021-11-19 MED ADMIN — Amisulpride (Antiemetic) IV Soln 5 MG/2ML: 5 mg | INTRAVENOUS | NDC 71390012521

## 2021-12-15 DIAGNOSIS — G4700 Insomnia, unspecified: Secondary | ICD-10-CM

## 2021-12-15 DIAGNOSIS — I10 Essential (primary) hypertension: Secondary | ICD-10-CM

## 2021-12-18 DIAGNOSIS — G4700 Insomnia, unspecified: Secondary | ICD-10-CM

## 2021-12-19 DIAGNOSIS — R140 Abdominal distension (gaseous): Secondary | ICD-10-CM | POA: Diagnosis not present

## 2021-12-19 DIAGNOSIS — K311 Adult hypertrophic pyloric stenosis: Secondary | ICD-10-CM

## 2021-12-19 DIAGNOSIS — I10 Essential (primary) hypertension: Secondary | ICD-10-CM

## 2021-12-19 DIAGNOSIS — R110 Nausea: Secondary | ICD-10-CM

## 2021-12-19 DIAGNOSIS — K9189 Other postprocedural complications and disorders of digestive system: Secondary | ICD-10-CM

## 2021-12-19 DIAGNOSIS — G4700 Insomnia, unspecified: Secondary | ICD-10-CM

## 2021-12-19 DIAGNOSIS — Z86010 Personal history of colonic polyps: Secondary | ICD-10-CM

## 2021-12-29 DIAGNOSIS — K2289 Other specified disease of esophagus: Secondary | ICD-10-CM | POA: Insufficient documentation

## 2021-12-29 DIAGNOSIS — Z7984 Long term (current) use of oral hypoglycemic drugs: Secondary | ICD-10-CM | POA: Diagnosis not present

## 2021-12-29 DIAGNOSIS — Z79899 Other long term (current) drug therapy: Secondary | ICD-10-CM | POA: Insufficient documentation

## 2021-12-29 DIAGNOSIS — F419 Anxiety disorder, unspecified: Secondary | ICD-10-CM | POA: Diagnosis not present

## 2021-12-29 DIAGNOSIS — Z86010 Personal history of colonic polyps: Secondary | ICD-10-CM | POA: Diagnosis not present

## 2021-12-29 DIAGNOSIS — E669 Obesity, unspecified: Secondary | ICD-10-CM | POA: Diagnosis not present

## 2021-12-29 DIAGNOSIS — Z8249 Family history of ischemic heart disease and other diseases of the circulatory system: Secondary | ICD-10-CM | POA: Diagnosis not present

## 2021-12-29 DIAGNOSIS — Z8711 Personal history of peptic ulcer disease: Secondary | ICD-10-CM | POA: Diagnosis not present

## 2021-12-29 DIAGNOSIS — Z9884 Bariatric surgery status: Secondary | ICD-10-CM | POA: Diagnosis not present

## 2021-12-29 DIAGNOSIS — I10 Essential (primary) hypertension: Secondary | ICD-10-CM | POA: Diagnosis not present

## 2021-12-29 DIAGNOSIS — K219 Gastro-esophageal reflux disease without esophagitis: Secondary | ICD-10-CM | POA: Insufficient documentation

## 2021-12-29 DIAGNOSIS — K449 Diaphragmatic hernia without obstruction or gangrene: Secondary | ICD-10-CM | POA: Insufficient documentation

## 2021-12-29 DIAGNOSIS — K913 Postprocedural intestinal obstruction, unspecified as to partial versus complete: Secondary | ICD-10-CM

## 2021-12-29 DIAGNOSIS — T182XXA Foreign body in stomach, initial encounter: Secondary | ICD-10-CM

## 2021-12-29 DIAGNOSIS — F17210 Nicotine dependence, cigarettes, uncomplicated: Secondary | ICD-10-CM | POA: Diagnosis not present

## 2021-12-29 DIAGNOSIS — Z980 Intestinal bypass and anastomosis status: Secondary | ICD-10-CM | POA: Diagnosis not present

## 2021-12-29 DIAGNOSIS — K5900 Constipation, unspecified: Secondary | ICD-10-CM | POA: Diagnosis not present

## 2021-12-29 DIAGNOSIS — E039 Hypothyroidism, unspecified: Secondary | ICD-10-CM | POA: Diagnosis not present

## 2021-12-29 DIAGNOSIS — K311 Adult hypertrophic pyloric stenosis: Secondary | ICD-10-CM | POA: Insufficient documentation

## 2021-12-29 DIAGNOSIS — E119 Type 2 diabetes mellitus without complications: Secondary | ICD-10-CM | POA: Diagnosis not present

## 2021-12-29 DIAGNOSIS — J45909 Unspecified asthma, uncomplicated: Secondary | ICD-10-CM | POA: Diagnosis not present

## 2021-12-29 DIAGNOSIS — Z6836 Body mass index (BMI) 36.0-36.9, adult: Secondary | ICD-10-CM | POA: Insufficient documentation

## 2021-12-29 DIAGNOSIS — F32A Depression, unspecified: Secondary | ICD-10-CM | POA: Insufficient documentation

## 2021-12-29 DIAGNOSIS — K9589 Other complications of other bariatric procedure: Secondary | ICD-10-CM | POA: Diagnosis not present

## 2021-12-29 DIAGNOSIS — Z4659 Encounter for fitting and adjustment of other gastrointestinal appliance and device: Secondary | ICD-10-CM | POA: Diagnosis not present

## 2021-12-29 DIAGNOSIS — Z833 Family history of diabetes mellitus: Secondary | ICD-10-CM | POA: Diagnosis not present

## 2021-12-29 DIAGNOSIS — K9189 Other postprocedural complications and disorders of digestive system: Secondary | ICD-10-CM

## 2021-12-29 HISTORY — PX: SUBMUCOSAL INJECTION: SHX5543

## 2021-12-29 HISTORY — PX: ESOPHAGOGASTRODUODENOSCOPY (EGD) WITH PROPOFOL: SHX5813

## 2021-12-29 HISTORY — PX: STENT REMOVAL: SHX6421

## 2021-12-29 HISTORY — PX: FOREIGN BODY REMOVAL: SHX962

## 2021-12-29 MED ADMIN — PROPOFOL 200 MG/20ML IV EMUL: 20 mg | INTRAVENOUS | NDC 00069020910

## 2021-12-29 MED ADMIN — Propofol IV Emul 500 MG/50ML (10 MG/ML): 125 ug/kg/min | INTRAVENOUS | NDC 00069023420

## 2021-12-29 MED ADMIN — Triamcinolone Acetonide Inj Susp 40 MG/ML: 4 mL | INTRAMUSCULAR | NDC 00003029305

## 2022-01-15 DIAGNOSIS — N62 Hypertrophy of breast: Secondary | ICD-10-CM

## 2022-01-15 DIAGNOSIS — Z720 Tobacco use: Secondary | ICD-10-CM

## 2022-01-22 DIAGNOSIS — N62 Hypertrophy of breast: Secondary | ICD-10-CM | POA: Insufficient documentation

## 2022-01-22 DIAGNOSIS — E119 Type 2 diabetes mellitus without complications: Secondary | ICD-10-CM | POA: Diagnosis not present

## 2022-01-22 DIAGNOSIS — Z01818 Encounter for other preprocedural examination: Secondary | ICD-10-CM | POA: Diagnosis present

## 2022-01-29 DIAGNOSIS — R937 Abnormal findings on diagnostic imaging of other parts of musculoskeletal system: Secondary | ICD-10-CM | POA: Diagnosis present

## 2022-01-29 DIAGNOSIS — M899 Disorder of bone, unspecified: Secondary | ICD-10-CM | POA: Diagnosis present

## 2022-01-29 MED ADMIN — Gadobutrol Inj 1 MMOL/ML (604.72 MG/ML): 9 mL | INTRAVENOUS | NDC 50419032502

## 2022-02-03 DIAGNOSIS — G709 Myoneural disorder, unspecified: Secondary | ICD-10-CM | POA: Insufficient documentation

## 2022-02-03 DIAGNOSIS — I10 Essential (primary) hypertension: Secondary | ICD-10-CM

## 2022-02-03 DIAGNOSIS — G4733 Obstructive sleep apnea (adult) (pediatric): Secondary | ICD-10-CM | POA: Diagnosis not present

## 2022-02-03 DIAGNOSIS — F32A Depression, unspecified: Secondary | ICD-10-CM | POA: Insufficient documentation

## 2022-02-03 DIAGNOSIS — N6011 Diffuse cystic mastopathy of right breast: Secondary | ICD-10-CM | POA: Insufficient documentation

## 2022-02-03 DIAGNOSIS — Z79899 Other long term (current) drug therapy: Secondary | ICD-10-CM | POA: Diagnosis not present

## 2022-02-03 DIAGNOSIS — K219 Gastro-esophageal reflux disease without esophagitis: Secondary | ICD-10-CM | POA: Insufficient documentation

## 2022-02-03 DIAGNOSIS — K449 Diaphragmatic hernia without obstruction or gangrene: Secondary | ICD-10-CM | POA: Diagnosis not present

## 2022-02-03 DIAGNOSIS — N62 Hypertrophy of breast: Secondary | ICD-10-CM | POA: Insufficient documentation

## 2022-02-03 DIAGNOSIS — K4090 Unilateral inguinal hernia, without obstruction or gangrene, not specified as recurrent: Secondary | ICD-10-CM | POA: Diagnosis not present

## 2022-02-03 DIAGNOSIS — F17210 Nicotine dependence, cigarettes, uncomplicated: Secondary | ICD-10-CM | POA: Diagnosis not present

## 2022-02-03 DIAGNOSIS — E119 Type 2 diabetes mellitus without complications: Secondary | ICD-10-CM | POA: Insufficient documentation

## 2022-02-03 DIAGNOSIS — Z7984 Long term (current) use of oral hypoglycemic drugs: Secondary | ICD-10-CM | POA: Insufficient documentation

## 2022-02-03 DIAGNOSIS — F419 Anxiety disorder, unspecified: Secondary | ICD-10-CM | POA: Insufficient documentation

## 2022-02-03 DIAGNOSIS — M793 Panniculitis, unspecified: Secondary | ICD-10-CM | POA: Diagnosis not present

## 2022-02-03 DIAGNOSIS — N6082 Other benign mammary dysplasias of left breast: Secondary | ICD-10-CM | POA: Diagnosis not present

## 2022-02-03 DIAGNOSIS — M1990 Unspecified osteoarthritis, unspecified site: Secondary | ICD-10-CM | POA: Diagnosis not present

## 2022-02-03 DIAGNOSIS — J45909 Unspecified asthma, uncomplicated: Secondary | ICD-10-CM | POA: Diagnosis not present

## 2022-02-03 HISTORY — PX: BREAST REDUCTION SURGERY: SHX8

## 2022-02-03 HISTORY — PX: INGUINAL HERNIA REPAIR: SHX194

## 2022-02-03 HISTORY — PX: PANNICULECTOMY: SHX5360

## 2022-02-03 MED ADMIN — Phenylephrine HCl IV Soln 10 MG/ML: 80 ug | INTRAVENOUS | NDC 00641614225

## 2022-02-03 MED ADMIN — Hydromorphone HCl Inj 1 MG/ML: 0.5 mg | INTRAVENOUS | NDC 76045000996

## 2022-02-03 MED ADMIN — Glycopyrrolate Inj 0.2 MG/ML: .1 mg | INTRAVENOUS | NDC 72572022525

## 2022-02-03 MED ADMIN — Gabapentin Cap 300 MG: 300 mg | ORAL | NDC 16714066202

## 2022-02-03 MED ADMIN — White Petrolatum-Mineral Oil Ophth Ointment: 1 | OPHTHALMIC | NDC 17478006235

## 2022-02-03 MED ADMIN — Dexmedetomidine HCl-NaCl Soln Pref Syr 20 MCG/5ML-0.9%: 8 ug | INTRAVENOUS | NDC 71449013111

## 2022-02-03 MED ADMIN — Midazolam HCl Inj 2 MG/2ML (Base Equivalent): 1 mg | INTRAVENOUS | NDC 63323041112

## 2022-02-03 MED ADMIN — Sugammadex Sodium IV 200 MG/2ML (Base Equivalent): 180 mg | INTRAVENOUS | NDC 99999070036

## 2022-02-03 MED ADMIN — Lidocaine HCl(Cardiac) IV PF Soln Pref Syr 100 MG/5ML (2%): 80 mg | INTRATRACHEAL | NDC 00409132305

## 2022-02-03 MED ADMIN — Propofol IV Emul 500 MG/50ML (10 MG/ML): 25 ug/kg/min | INTRAVENOUS | NDC 00069023420

## 2022-02-03 MED ADMIN — Fentanyl Citrate Preservative Free (PF) Inj 100 MCG/2ML: 50 ug | INTRAVENOUS | NDC 72572017025

## 2022-02-03 MED ADMIN — Fentanyl Citrate Preservative Free (PF) Inj 100 MCG/2ML: 100 ug | INTRAVENOUS | NDC 72572017025

## 2022-02-03 MED ADMIN — Cefazolin Sodium-Dextrose IV Solution 2 GM/100ML-4%: 2 g | INTRAVENOUS | NDC 00338350841

## 2022-02-03 MED ADMIN — Dexamethasone Sodium Phosphate Inj 10 MG/ML: 10 mg | INTRAVENOUS | NDC 72572012201

## 2022-02-03 MED ADMIN — TUMESCENT SOLUTION (BUPIVACINE, EPI, LR): 2000 mL | NDC 00409115902

## 2022-02-03 MED ADMIN — Ondansetron HCl Inj 4 MG/2ML (2 MG/ML): 4 mg | INTRAVENOUS | NDC 00409475503

## 2022-02-03 MED ADMIN — PROPOFOL 200 MG/20ML IV EMUL: 160 mg | INTRAVENOUS | NDC 00069020910

## 2022-02-03 MED ADMIN — Acetaminophen Tab 500 MG: 1000 mg | ORAL | NDC 49348004214

## 2022-02-03 MED ADMIN — Rocuronium Bromide IV Soln 100 MG/10ML (10 MG/ML): 60 mg | INTRAVENOUS | NDC 72572065110

## 2022-02-03 MED ADMIN — Sodium Chloride Irrigation Soln 0.9%: 1000 mL | NDC 99999050048

## 2022-02-06 DIAGNOSIS — N6099 Unspecified benign mammary dysplasia of unspecified breast: Secondary | ICD-10-CM

## 2022-02-11 DIAGNOSIS — N62 Hypertrophy of breast: Secondary | ICD-10-CM

## 2022-02-11 DIAGNOSIS — M546 Pain in thoracic spine: Secondary | ICD-10-CM

## 2022-02-11 DIAGNOSIS — M545 Low back pain, unspecified: Secondary | ICD-10-CM

## 2022-02-11 DIAGNOSIS — M793 Panniculitis, unspecified: Secondary | ICD-10-CM

## 2022-02-12 DIAGNOSIS — E669 Obesity, unspecified: Secondary | ICD-10-CM

## 2022-02-12 DIAGNOSIS — I10 Essential (primary) hypertension: Secondary | ICD-10-CM

## 2022-02-19 DIAGNOSIS — N62 Hypertrophy of breast: Secondary | ICD-10-CM

## 2022-02-19 DIAGNOSIS — M793 Panniculitis, unspecified: Secondary | ICD-10-CM

## 2022-02-26 DIAGNOSIS — J4520 Mild intermittent asthma, uncomplicated: Secondary | ICD-10-CM

## 2022-02-26 DIAGNOSIS — E1159 Type 2 diabetes mellitus with other circulatory complications: Secondary | ICD-10-CM

## 2022-02-26 DIAGNOSIS — I10 Essential (primary) hypertension: Secondary | ICD-10-CM

## 2022-02-26 DIAGNOSIS — I152 Hypertension secondary to endocrine disorders: Secondary | ICD-10-CM | POA: Diagnosis not present

## 2022-02-26 DIAGNOSIS — F32A Depression, unspecified: Secondary | ICD-10-CM

## 2022-02-26 DIAGNOSIS — G4700 Insomnia, unspecified: Secondary | ICD-10-CM

## 2022-02-26 DIAGNOSIS — R748 Abnormal levels of other serum enzymes: Secondary | ICD-10-CM | POA: Diagnosis not present

## 2022-02-26 DIAGNOSIS — E039 Hypothyroidism, unspecified: Secondary | ICD-10-CM | POA: Diagnosis not present

## 2022-02-26 DIAGNOSIS — E785 Hyperlipidemia, unspecified: Secondary | ICD-10-CM

## 2022-02-26 DIAGNOSIS — F419 Anxiety disorder, unspecified: Secondary | ICD-10-CM

## 2022-02-26 DIAGNOSIS — R7989 Other specified abnormal findings of blood chemistry: Secondary | ICD-10-CM | POA: Diagnosis not present

## 2022-02-26 DIAGNOSIS — Z1231 Encounter for screening mammogram for malignant neoplasm of breast: Secondary | ICD-10-CM

## 2022-02-26 DIAGNOSIS — F17200 Nicotine dependence, unspecified, uncomplicated: Secondary | ICD-10-CM

## 2022-02-26 DIAGNOSIS — F5089 Other specified eating disorder: Secondary | ICD-10-CM | POA: Diagnosis not present

## 2022-02-26 DIAGNOSIS — Z23 Encounter for immunization: Secondary | ICD-10-CM

## 2022-02-26 DIAGNOSIS — R1909 Other intra-abdominal and pelvic swelling, mass and lump: Secondary | ICD-10-CM

## 2022-02-26 DIAGNOSIS — E119 Type 2 diabetes mellitus without complications: Secondary | ICD-10-CM

## 2022-02-26 DIAGNOSIS — N6091 Unspecified benign mammary dysplasia of right breast: Secondary | ICD-10-CM

## 2022-02-26 DIAGNOSIS — Z9889 Other specified postprocedural states: Secondary | ICD-10-CM

## 2022-02-26 HISTORY — DX: Unspecified benign mammary dysplasia of right breast: N60.91

## 2022-02-26 MED ORDER — CYCLOBENZAPRINE HCL 10 MG PO TABS
10.0000 mg | ORAL_TABLET | Freq: Three times a day (TID) | ORAL | 5 refills | Status: DC
Start: 2022-02-26 — End: 2022-06-01

## 2022-02-26 MED ORDER — VALACYCLOVIR HCL 1 G PO TABS
1000.0000 mg | ORAL_TABLET | Freq: Two times a day (BID) | ORAL | 11 refills | Status: DC
Start: 2022-02-26 — End: 2022-06-01

## 2022-03-11 DIAGNOSIS — N62 Hypertrophy of breast: Secondary | ICD-10-CM

## 2022-03-13 DIAGNOSIS — Z87891 Personal history of nicotine dependence: Secondary | ICD-10-CM | POA: Diagnosis not present

## 2022-03-13 DIAGNOSIS — N6089 Other benign mammary dysplasias of unspecified breast: Secondary | ICD-10-CM | POA: Diagnosis not present

## 2022-03-13 DIAGNOSIS — Z9884 Bariatric surgery status: Secondary | ICD-10-CM | POA: Diagnosis not present

## 2022-03-13 DIAGNOSIS — N6099 Unspecified benign mammary dysplasia of unspecified breast: Secondary | ICD-10-CM | POA: Diagnosis not present

## 2022-03-13 DIAGNOSIS — N6091 Unspecified benign mammary dysplasia of right breast: Secondary | ICD-10-CM | POA: Insufficient documentation

## 2022-03-31 DIAGNOSIS — E039 Hypothyroidism, unspecified: Secondary | ICD-10-CM

## 2022-03-31 DIAGNOSIS — T753XXA Motion sickness, initial encounter: Secondary | ICD-10-CM

## 2022-03-31 DIAGNOSIS — M5416 Radiculopathy, lumbar region: Secondary | ICD-10-CM

## 2022-03-31 DIAGNOSIS — J4520 Mild intermittent asthma, uncomplicated: Secondary | ICD-10-CM

## 2022-03-31 DIAGNOSIS — R112 Nausea with vomiting, unspecified: Secondary | ICD-10-CM

## 2022-03-31 DIAGNOSIS — K311 Adult hypertrophic pyloric stenosis: Secondary | ICD-10-CM

## 2022-04-15 DIAGNOSIS — E119 Type 2 diabetes mellitus without complications: Secondary | ICD-10-CM

## 2022-04-15 DIAGNOSIS — E785 Hyperlipidemia, unspecified: Secondary | ICD-10-CM

## 2022-04-22 DIAGNOSIS — N62 Hypertrophy of breast: Secondary | ICD-10-CM

## 2022-04-22 DIAGNOSIS — M793 Panniculitis, unspecified: Secondary | ICD-10-CM

## 2022-04-24 DIAGNOSIS — I10 Essential (primary) hypertension: Secondary | ICD-10-CM

## 2022-04-27 DIAGNOSIS — G4700 Insomnia, unspecified: Secondary | ICD-10-CM

## 2022-04-27 DIAGNOSIS — F5104 Psychophysiologic insomnia: Secondary | ICD-10-CM

## 2022-05-10 DIAGNOSIS — E611 Iron deficiency: Secondary | ICD-10-CM

## 2022-05-20 DIAGNOSIS — R110 Nausea: Secondary | ICD-10-CM

## 2022-05-20 DIAGNOSIS — K311 Adult hypertrophic pyloric stenosis: Secondary | ICD-10-CM

## 2022-05-20 DIAGNOSIS — R112 Nausea with vomiting, unspecified: Secondary | ICD-10-CM

## 2022-05-21 DIAGNOSIS — R112 Nausea with vomiting, unspecified: Secondary | ICD-10-CM

## 2022-05-21 DIAGNOSIS — K311 Adult hypertrophic pyloric stenosis: Secondary | ICD-10-CM | POA: Diagnosis not present

## 2022-05-21 DIAGNOSIS — R110 Nausea: Secondary | ICD-10-CM

## 2022-05-25 DIAGNOSIS — R110 Nausea: Secondary | ICD-10-CM

## 2022-05-25 DIAGNOSIS — K311 Adult hypertrophic pyloric stenosis: Secondary | ICD-10-CM | POA: Insufficient documentation

## 2022-05-25 DIAGNOSIS — R112 Nausea with vomiting, unspecified: Secondary | ICD-10-CM | POA: Insufficient documentation

## 2022-05-25 DIAGNOSIS — K9189 Other postprocedural complications and disorders of digestive system: Secondary | ICD-10-CM

## 2022-05-25 MED ADMIN — Iohexol Inj 300 MG/ML: 100 mL | INTRAVENOUS | NDC 00407141363

## 2022-05-29 DIAGNOSIS — N6091 Unspecified benign mammary dysplasia of right breast: Secondary | ICD-10-CM

## 2022-05-31 DIAGNOSIS — Z9889 Other specified postprocedural states: Secondary | ICD-10-CM | POA: Insufficient documentation

## 2022-05-31 DIAGNOSIS — R1909 Other intra-abdominal and pelvic swelling, mass and lump: Secondary | ICD-10-CM | POA: Insufficient documentation

## 2022-05-31 DIAGNOSIS — I700 Atherosclerosis of aorta: Secondary | ICD-10-CM | POA: Insufficient documentation

## 2022-05-31 HISTORY — DX: Other intra-abdominal and pelvic swelling, mass and lump: R19.09

## 2022-05-31 HISTORY — DX: Other specified postprocedural states: Z98.890

## 2022-06-01 DIAGNOSIS — N898 Other specified noninflammatory disorders of vagina: Secondary | ICD-10-CM

## 2022-06-01 DIAGNOSIS — Z124 Encounter for screening for malignant neoplasm of cervix: Secondary | ICD-10-CM | POA: Insufficient documentation

## 2022-06-01 DIAGNOSIS — Z170 Estrogen receptor positive status [ER+]: Secondary | ICD-10-CM | POA: Insufficient documentation

## 2022-06-01 DIAGNOSIS — Z79810 Long term (current) use of selective estrogen receptor modulators (SERMs): Secondary | ICD-10-CM | POA: Diagnosis not present

## 2022-06-01 DIAGNOSIS — G4700 Insomnia, unspecified: Secondary | ICD-10-CM

## 2022-06-01 DIAGNOSIS — N6091 Unspecified benign mammary dysplasia of right breast: Secondary | ICD-10-CM

## 2022-06-01 DIAGNOSIS — Z23 Encounter for immunization: Secondary | ICD-10-CM | POA: Diagnosis not present

## 2022-06-01 DIAGNOSIS — E1159 Type 2 diabetes mellitus with other circulatory complications: Secondary | ICD-10-CM | POA: Diagnosis not present

## 2022-06-01 DIAGNOSIS — N6099 Unspecified benign mammary dysplasia of unspecified breast: Secondary | ICD-10-CM | POA: Insufficient documentation

## 2022-06-01 DIAGNOSIS — E611 Iron deficiency: Secondary | ICD-10-CM | POA: Diagnosis not present

## 2022-06-01 DIAGNOSIS — Z79899 Other long term (current) drug therapy: Secondary | ICD-10-CM | POA: Diagnosis not present

## 2022-06-01 DIAGNOSIS — R112 Nausea with vomiting, unspecified: Secondary | ICD-10-CM

## 2022-06-01 DIAGNOSIS — F339 Major depressive disorder, recurrent, unspecified: Secondary | ICD-10-CM

## 2022-06-01 DIAGNOSIS — I152 Hypertension secondary to endocrine disorders: Secondary | ICD-10-CM | POA: Diagnosis not present

## 2022-06-01 DIAGNOSIS — Z9884 Bariatric surgery status: Secondary | ICD-10-CM | POA: Diagnosis not present

## 2022-06-01 DIAGNOSIS — M5416 Radiculopathy, lumbar region: Secondary | ICD-10-CM

## 2022-06-01 DIAGNOSIS — R634 Abnormal weight loss: Secondary | ICD-10-CM

## 2022-06-01 DIAGNOSIS — E039 Hypothyroidism, unspecified: Secondary | ICD-10-CM | POA: Diagnosis not present

## 2022-06-01 DIAGNOSIS — E876 Hypokalemia: Secondary | ICD-10-CM

## 2022-06-01 DIAGNOSIS — F419 Anxiety disorder, unspecified: Secondary | ICD-10-CM

## 2022-06-01 DIAGNOSIS — F17200 Nicotine dependence, unspecified, uncomplicated: Secondary | ICD-10-CM

## 2022-06-01 DIAGNOSIS — I10 Essential (primary) hypertension: Secondary | ICD-10-CM

## 2022-06-01 DIAGNOSIS — E785 Hyperlipidemia, unspecified: Secondary | ICD-10-CM

## 2022-06-01 DIAGNOSIS — K9189 Other postprocedural complications and disorders of digestive system: Secondary | ICD-10-CM

## 2022-06-01 DIAGNOSIS — K311 Adult hypertrophic pyloric stenosis: Secondary | ICD-10-CM

## 2022-06-01 DIAGNOSIS — J4520 Mild intermittent asthma, uncomplicated: Secondary | ICD-10-CM

## 2022-06-01 DIAGNOSIS — F32A Depression, unspecified: Secondary | ICD-10-CM

## 2022-06-01 DIAGNOSIS — F5104 Psychophysiologic insomnia: Secondary | ICD-10-CM

## 2022-06-01 DIAGNOSIS — E119 Type 2 diabetes mellitus without complications: Secondary | ICD-10-CM

## 2022-06-02 DIAGNOSIS — B9689 Other specified bacterial agents as the cause of diseases classified elsewhere: Secondary | ICD-10-CM

## 2022-06-04 DIAGNOSIS — K311 Adult hypertrophic pyloric stenosis: Secondary | ICD-10-CM | POA: Diagnosis present

## 2022-06-04 DIAGNOSIS — R112 Nausea with vomiting, unspecified: Secondary | ICD-10-CM | POA: Insufficient documentation

## 2022-06-04 DIAGNOSIS — R110 Nausea: Secondary | ICD-10-CM | POA: Diagnosis present

## 2022-06-08 DIAGNOSIS — K311 Adult hypertrophic pyloric stenosis: Secondary | ICD-10-CM

## 2022-06-08 DIAGNOSIS — K9189 Other postprocedural complications and disorders of digestive system: Secondary | ICD-10-CM | POA: Diagnosis not present

## 2022-06-08 DIAGNOSIS — R110 Nausea: Secondary | ICD-10-CM | POA: Diagnosis not present

## 2022-06-08 DIAGNOSIS — K913 Postprocedural intestinal obstruction, unspecified as to partial versus complete: Secondary | ICD-10-CM | POA: Diagnosis not present

## 2022-06-15 DIAGNOSIS — R112 Nausea with vomiting, unspecified: Secondary | ICD-10-CM | POA: Diagnosis present

## 2022-06-15 DIAGNOSIS — Z87891 Personal history of nicotine dependence: Secondary | ICD-10-CM | POA: Insufficient documentation

## 2022-06-15 DIAGNOSIS — K289 Gastrojejunal ulcer, unspecified as acute or chronic, without hemorrhage or perforation: Secondary | ICD-10-CM | POA: Diagnosis not present

## 2022-06-15 DIAGNOSIS — K2289 Other specified disease of esophagus: Secondary | ICD-10-CM | POA: Insufficient documentation

## 2022-06-15 DIAGNOSIS — Z9884 Bariatric surgery status: Secondary | ICD-10-CM | POA: Diagnosis not present

## 2022-06-15 DIAGNOSIS — K449 Diaphragmatic hernia without obstruction or gangrene: Secondary | ICD-10-CM | POA: Diagnosis not present

## 2022-06-15 DIAGNOSIS — R1013 Epigastric pain: Secondary | ICD-10-CM | POA: Diagnosis present

## 2022-06-15 DIAGNOSIS — Z79899 Other long term (current) drug therapy: Secondary | ICD-10-CM | POA: Diagnosis not present

## 2022-06-15 DIAGNOSIS — Z6832 Body mass index (BMI) 32.0-32.9, adult: Secondary | ICD-10-CM | POA: Diagnosis not present

## 2022-06-15 DIAGNOSIS — G4730 Sleep apnea, unspecified: Secondary | ICD-10-CM | POA: Diagnosis not present

## 2022-06-15 DIAGNOSIS — K9189 Other postprocedural complications and disorders of digestive system: Secondary | ICD-10-CM

## 2022-06-15 DIAGNOSIS — I10 Essential (primary) hypertension: Secondary | ICD-10-CM | POA: Diagnosis not present

## 2022-06-15 DIAGNOSIS — E669 Obesity, unspecified: Secondary | ICD-10-CM | POA: Diagnosis not present

## 2022-06-15 DIAGNOSIS — Z8711 Personal history of peptic ulcer disease: Secondary | ICD-10-CM | POA: Insufficient documentation

## 2022-06-15 DIAGNOSIS — K219 Gastro-esophageal reflux disease without esophagitis: Secondary | ICD-10-CM | POA: Diagnosis not present

## 2022-06-15 DIAGNOSIS — E119 Type 2 diabetes mellitus without complications: Secondary | ICD-10-CM | POA: Insufficient documentation

## 2022-06-15 DIAGNOSIS — E039 Hypothyroidism, unspecified: Secondary | ICD-10-CM | POA: Insufficient documentation

## 2022-06-15 DIAGNOSIS — Z7984 Long term (current) use of oral hypoglycemic drugs: Secondary | ICD-10-CM | POA: Diagnosis not present

## 2022-06-15 DIAGNOSIS — K5900 Constipation, unspecified: Secondary | ICD-10-CM | POA: Diagnosis not present

## 2022-06-15 DIAGNOSIS — K311 Adult hypertrophic pyloric stenosis: Secondary | ICD-10-CM

## 2022-06-15 DIAGNOSIS — R110 Nausea: Secondary | ICD-10-CM

## 2022-06-15 DIAGNOSIS — J449 Chronic obstructive pulmonary disease, unspecified: Secondary | ICD-10-CM | POA: Diagnosis not present

## 2022-06-15 DIAGNOSIS — Z980 Intestinal bypass and anastomosis status: Secondary | ICD-10-CM | POA: Insufficient documentation

## 2022-06-15 HISTORY — DX: Anemia, unspecified: D64.9

## 2022-06-15 HISTORY — PX: BILIARY STENT PLACEMENT: SHX5538

## 2022-06-15 HISTORY — PX: ESOPHAGOGASTRODUODENOSCOPY (EGD) WITH PROPOFOL: SHX5813

## 2022-06-15 HISTORY — DX: Personal history of other diseases of the digestive system: Z87.19

## 2022-06-15 HISTORY — DX: Myoneural disorder, unspecified: G70.9

## 2022-06-15 HISTORY — DX: Personal history of urinary calculi: Z87.442

## 2022-06-15 HISTORY — PX: BALLOON DILATION: SHX5330

## 2022-06-15 HISTORY — DX: Unspecified osteoarthritis, unspecified site: M19.90

## 2022-06-15 MED ADMIN — Lactated Ringer's Solution: 20 mL/h | INTRAVENOUS | NDC 00338011704

## 2022-06-15 MED ADMIN — Dexmedetomidine HCl in NaCl 0.9% IV Soln 80 MCG/20ML: 4 ug | BUCCAL | NDC 00781349395

## 2022-06-15 MED ADMIN — PROPOFOL 200 MG/20ML IV EMUL: 50 mg | INTRAVENOUS | NDC 00069020910

## 2022-06-15 MED ADMIN — PROPOFOL 200 MG/20ML IV EMUL: 20 mg | INTRAVENOUS | NDC 00069020910

## 2022-06-15 MED ADMIN — Propofol IV Emul 500 MG/50ML (10 MG/ML): 125 ug/kg/min | INTRAVENOUS | NDC 00069023420

## 2022-06-15 MED ADMIN — Ondansetron HCl Inj 4 MG/2ML (2 MG/ML): 4 mg | INTRAVENOUS | NDC 00409475503

## 2022-06-26 DIAGNOSIS — Z9889 Other specified postprocedural states: Secondary | ICD-10-CM

## 2022-06-26 DIAGNOSIS — K311 Adult hypertrophic pyloric stenosis: Secondary | ICD-10-CM | POA: Diagnosis not present

## 2022-07-14 DIAGNOSIS — J069 Acute upper respiratory infection, unspecified: Secondary | ICD-10-CM | POA: Diagnosis not present

## 2022-07-14 HISTORY — DX: Acute upper respiratory infection, unspecified: J06.9

## 2022-07-28 DIAGNOSIS — E1142 Type 2 diabetes mellitus with diabetic polyneuropathy: Secondary | ICD-10-CM | POA: Diagnosis not present

## 2022-07-28 DIAGNOSIS — R748 Abnormal levels of other serum enzymes: Secondary | ICD-10-CM

## 2022-07-28 DIAGNOSIS — E890 Postprocedural hypothyroidism: Secondary | ICD-10-CM

## 2022-07-28 DIAGNOSIS — E876 Hypokalemia: Secondary | ICD-10-CM

## 2022-07-31 DIAGNOSIS — Z419 Encounter for procedure for purposes other than remedying health state, unspecified: Secondary | ICD-10-CM | POA: Diagnosis not present

## 2022-08-06 DIAGNOSIS — F339 Major depressive disorder, recurrent, unspecified: Secondary | ICD-10-CM

## 2022-08-06 DIAGNOSIS — I152 Hypertension secondary to endocrine disorders: Secondary | ICD-10-CM | POA: Diagnosis not present

## 2022-08-06 DIAGNOSIS — J4520 Mild intermittent asthma, uncomplicated: Secondary | ICD-10-CM | POA: Diagnosis not present

## 2022-08-06 DIAGNOSIS — E039 Hypothyroidism, unspecified: Secondary | ICD-10-CM | POA: Diagnosis not present

## 2022-08-06 DIAGNOSIS — I700 Atherosclerosis of aorta: Secondary | ICD-10-CM | POA: Diagnosis not present

## 2022-08-06 DIAGNOSIS — E1159 Type 2 diabetes mellitus with other circulatory complications: Secondary | ICD-10-CM | POA: Diagnosis not present

## 2022-08-06 DIAGNOSIS — E0500 Thyrotoxicosis with diffuse goiter without thyrotoxic crisis or storm: Secondary | ICD-10-CM

## 2022-08-06 DIAGNOSIS — K9189 Other postprocedural complications and disorders of digestive system: Secondary | ICD-10-CM

## 2022-08-06 DIAGNOSIS — E1142 Type 2 diabetes mellitus with diabetic polyneuropathy: Secondary | ICD-10-CM | POA: Diagnosis not present

## 2022-08-06 DIAGNOSIS — B3731 Acute candidiasis of vulva and vagina: Secondary | ICD-10-CM

## 2022-08-13 DIAGNOSIS — R1013 Epigastric pain: Secondary | ICD-10-CM

## 2022-08-13 DIAGNOSIS — K311 Adult hypertrophic pyloric stenosis: Secondary | ICD-10-CM | POA: Diagnosis not present

## 2022-08-13 DIAGNOSIS — R112 Nausea with vomiting, unspecified: Secondary | ICD-10-CM | POA: Diagnosis not present

## 2022-08-13 DIAGNOSIS — R1012 Left upper quadrant pain: Secondary | ICD-10-CM | POA: Diagnosis not present

## 2022-08-24 DIAGNOSIS — B3731 Acute candidiasis of vulva and vagina: Secondary | ICD-10-CM | POA: Insufficient documentation

## 2022-08-26 DIAGNOSIS — G4733 Obstructive sleep apnea (adult) (pediatric): Secondary | ICD-10-CM

## 2022-08-27 DIAGNOSIS — D638 Anemia in other chronic diseases classified elsewhere: Secondary | ICD-10-CM | POA: Diagnosis not present

## 2022-08-27 DIAGNOSIS — E038 Other specified hypothyroidism: Secondary | ICD-10-CM | POA: Diagnosis not present

## 2022-08-27 DIAGNOSIS — Z800 Family history of malignant neoplasm of digestive organs: Secondary | ICD-10-CM | POA: Diagnosis not present

## 2022-08-27 DIAGNOSIS — Z4659 Encounter for fitting and adjustment of other gastrointestinal appliance and device: Secondary | ICD-10-CM

## 2022-08-27 DIAGNOSIS — J45909 Unspecified asthma, uncomplicated: Secondary | ICD-10-CM | POA: Diagnosis not present

## 2022-08-27 DIAGNOSIS — E119 Type 2 diabetes mellitus without complications: Secondary | ICD-10-CM | POA: Insufficient documentation

## 2022-08-27 DIAGNOSIS — Z87891 Personal history of nicotine dependence: Secondary | ICD-10-CM | POA: Insufficient documentation

## 2022-08-27 DIAGNOSIS — E039 Hypothyroidism, unspecified: Secondary | ICD-10-CM | POA: Insufficient documentation

## 2022-08-27 DIAGNOSIS — Z9884 Bariatric surgery status: Secondary | ICD-10-CM | POA: Insufficient documentation

## 2022-08-27 DIAGNOSIS — K3189 Other diseases of stomach and duodenum: Secondary | ICD-10-CM

## 2022-08-27 DIAGNOSIS — K219 Gastro-esophageal reflux disease without esophagitis: Secondary | ICD-10-CM | POA: Insufficient documentation

## 2022-08-27 DIAGNOSIS — K2289 Other specified disease of esophagus: Secondary | ICD-10-CM

## 2022-08-27 DIAGNOSIS — Z79899 Other long term (current) drug therapy: Secondary | ICD-10-CM | POA: Insufficient documentation

## 2022-08-27 DIAGNOSIS — K9589 Other complications of other bariatric procedure: Secondary | ICD-10-CM | POA: Diagnosis not present

## 2022-08-27 DIAGNOSIS — K311 Adult hypertrophic pyloric stenosis: Secondary | ICD-10-CM

## 2022-08-27 DIAGNOSIS — K449 Diaphragmatic hernia without obstruction or gangrene: Secondary | ICD-10-CM | POA: Insufficient documentation

## 2022-08-27 DIAGNOSIS — K9189 Other postprocedural complications and disorders of digestive system: Secondary | ICD-10-CM

## 2022-08-27 DIAGNOSIS — Z980 Intestinal bypass and anastomosis status: Secondary | ICD-10-CM | POA: Diagnosis not present

## 2022-08-27 DIAGNOSIS — I10 Essential (primary) hypertension: Secondary | ICD-10-CM | POA: Insufficient documentation

## 2022-08-27 DIAGNOSIS — G4730 Sleep apnea, unspecified: Secondary | ICD-10-CM | POA: Diagnosis not present

## 2022-08-27 DIAGNOSIS — Z83719 Family history of colon polyps, unspecified: Secondary | ICD-10-CM | POA: Insufficient documentation

## 2022-08-27 HISTORY — PX: ESOPHAGOGASTRODUODENOSCOPY (EGD) WITH PROPOFOL: SHX5813

## 2022-08-27 HISTORY — PX: SCLEROTHERAPY: SHX6841

## 2022-08-27 HISTORY — PX: GASTROINTESTINAL STENT REMOVAL: SHX6384

## 2022-08-27 IMAGING — MR MR THORACIC SPINE WO/W CM
7 of 9 series · 30 of 48 positions shown · IV contrast (gadavist)
Comparison: MRI 10/12/2021

CLINICAL DATA: Bone lesion, thoracic spine, incidental

EXAM:
MRI THORACIC WITHOUT AND WITH CONTRAST
TECHNIQUE: Multiplanar and multiecho pulse sequences of the thoracic spine were
obtained without and with intravenous contrast.
CONTRAST:  9mL GADAVIST GADOBUTROL 1 MMOL/ML IV SOLN

[Series 16: T1 · sagittal · 5.0mm · 1.41mm/px · 3 of 9 slices shown (1 of 3)]
[im 1/9]
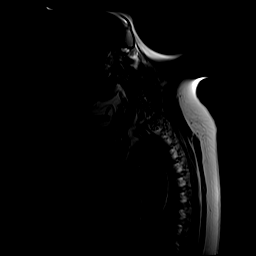
[im 5/9]
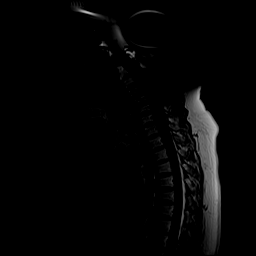
[im 9/9]
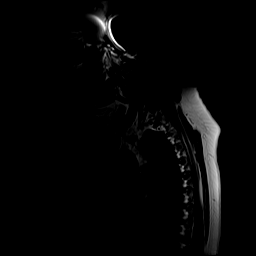

[Series 17: T2 · sagittal · 3.0mm · 1.06mm/px · 4 of 17 slices shown (1 of 2)]
[im 1/17]
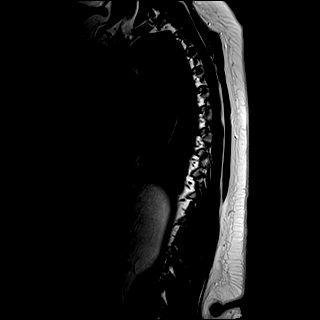
[im 6/17]
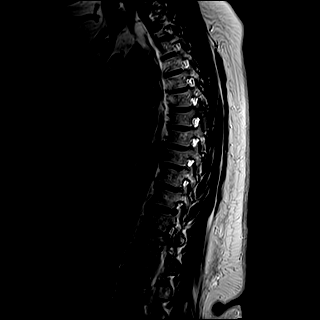
[im 11/17]
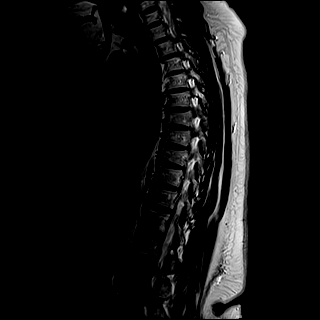
[im 17/17]
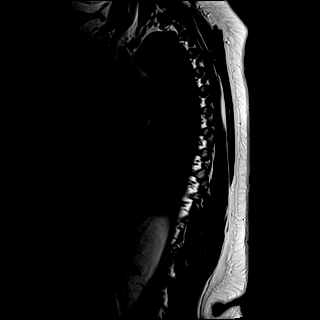

[Series 18: T1 · sagittal · 3.0mm · 1.06mm/px · 3 of 17 slices shown (2 of 3)]
[im 1/17]
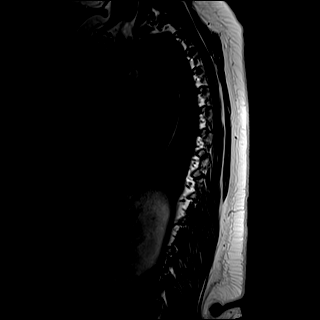
[im 9/17]
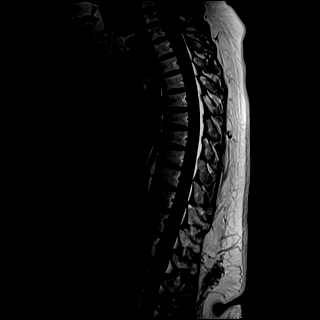
[im 17/17]
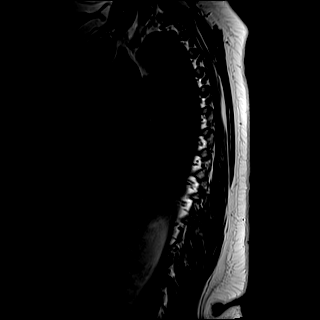

[Series 19: STIR · sagittal · 3.0mm · 0.53mm/px · 1 of 17 slices shown]
[im 1/17]
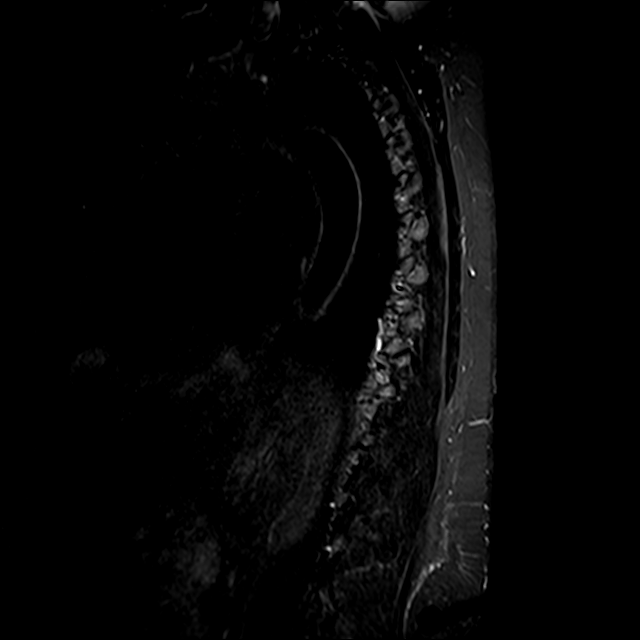

[Series 20: T2 · axial · 4.0mm · 0.74mm/px · z∈[-211,-17]mm · 8 of 40 slices shown (2 of 2)]
[im 1/40]
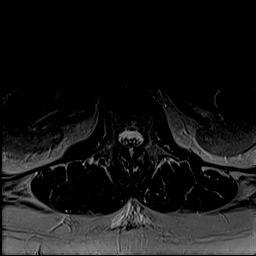
[im 6/40]
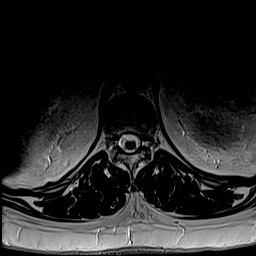
[im 12/40]
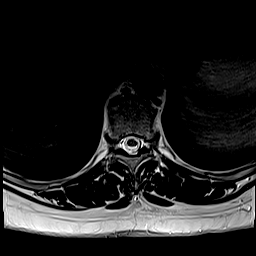
[im 17/40]
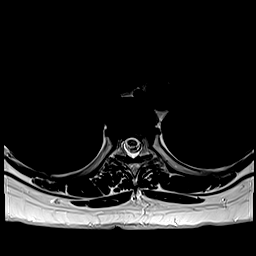
[im 23/40]
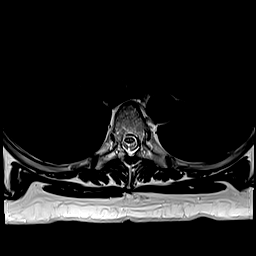
[im 28/40]
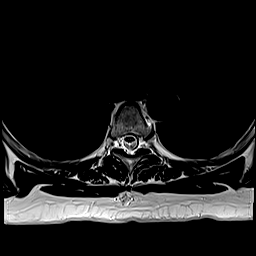
[im 34/40]
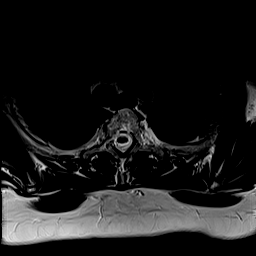
[im 40/40]
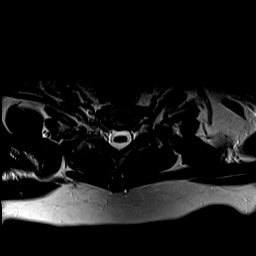

[Series 22: T1 · axial · non-contrast · 4.0mm · 0.31mm/px · z∈[-211,-17]mm · 8 of 40 slices shown (3 of 3)]
[im 1/40]
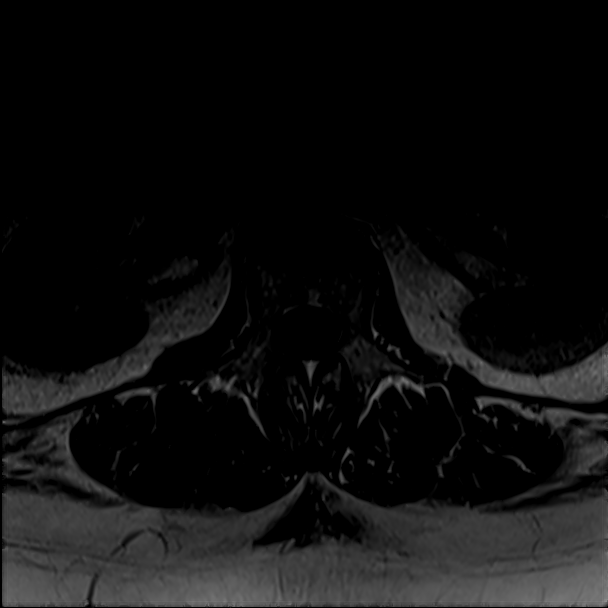
[im 6/40]
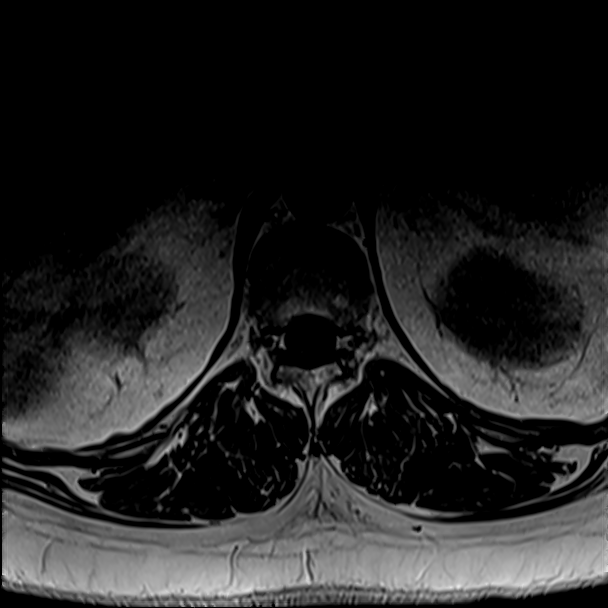
[im 12/40]
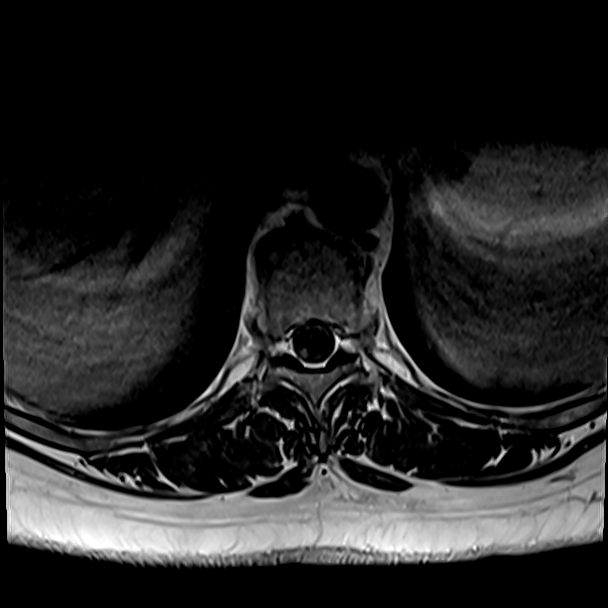
[im 17/40]
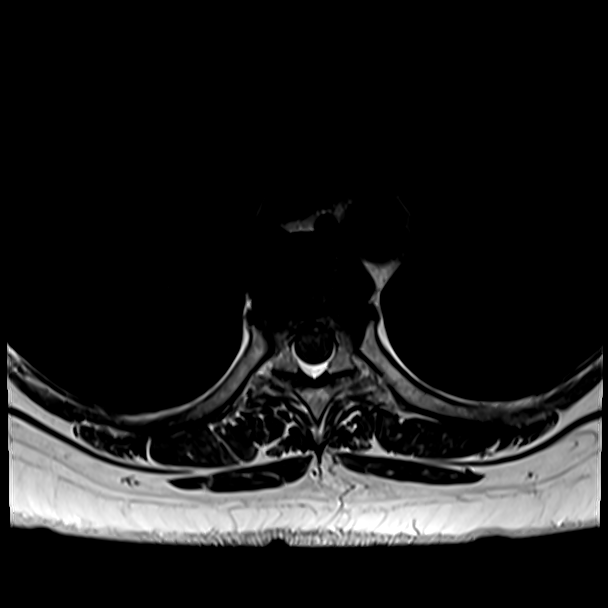
[im 23/40]
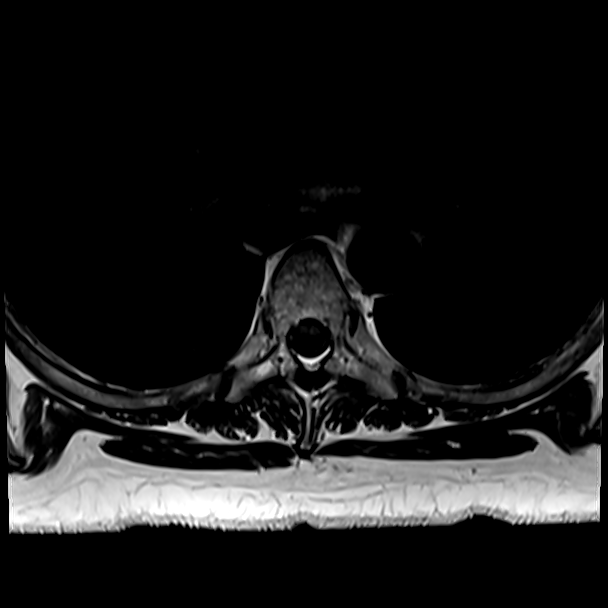
[im 28/40]
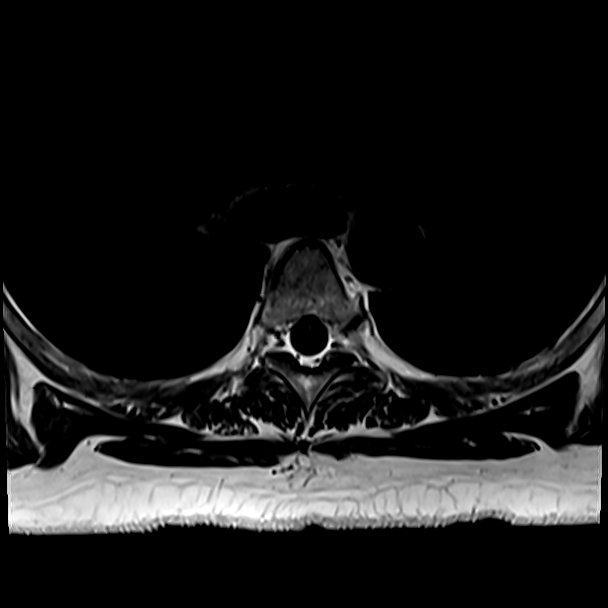
[im 34/40]
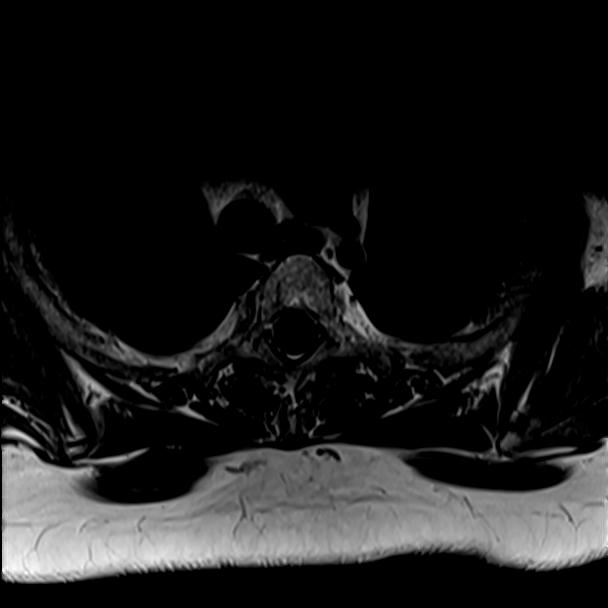
[im 40/40]
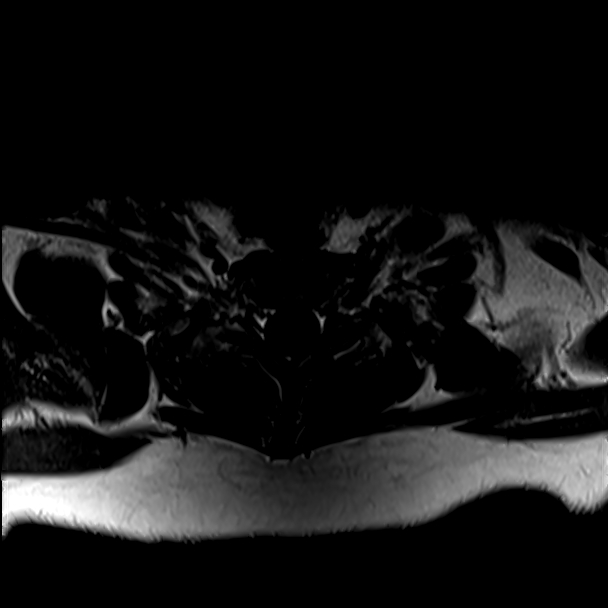

[Series 24: T1 fat-sat post-contrast · sagittal · 3.0mm · 1.06mm/px · 3 of 17 slices shown]
[im 1/17]
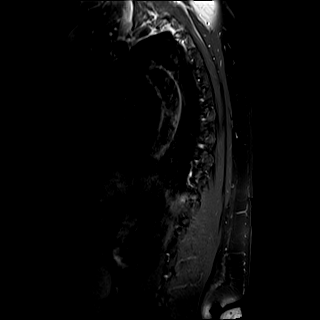
[im 9/17]
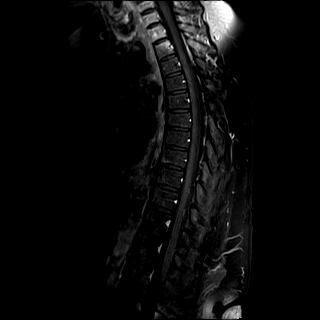
[im 17/17]
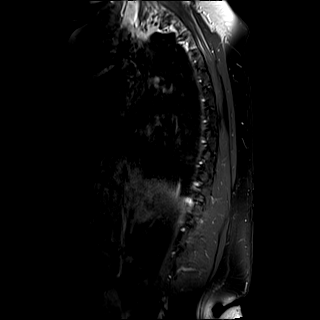

[30 of 48 positions shown; findings below may reference images not displayed]

FINDINGS: Alignment:  Stable.

Vertebrae: Stable vertebral body heights. There remains mild T1
hypointensity at the left T6 facets. There is no marrow edema or
enhancement. No new abnormal signal.

Cord:  No abnormal signal.

Paraspinal and other soft tissues: Unremarkable.

Disc levels: Intervertebral disc heights and signal are maintained.
Mild facet arthropathy. No new canal or foraminal stenosis.
IMPRESSION: Abnormal signal at T6 is almost certainly on a degenerative basis
with no evidence to support an underlying lesion. No new findings.

## 2022-08-27 MED ADMIN — Lactated Ringer's Solution: 1000 mL | INTRAVENOUS | NDC 00338011704

## 2022-08-27 MED ADMIN — Propofol IV Emul 500 MG/50ML (10 MG/ML): 125 ug/kg/min | INTRAVENOUS | NDC 00069023420

## 2022-08-27 MED ADMIN — PROPOFOL 200 MG/20ML IV EMUL: 20 mg | INTRAVENOUS | NDC 00069020910

## 2022-08-27 MED ADMIN — PROPOFOL 200 MG/20ML IV EMUL: 30 mg | INTRAVENOUS | NDC 00069020910

## 2022-08-27 MED ADMIN — Lidocaine HCl Local Soln Prefilled Syringe 100 MG/5ML (2%): 60 mg | INTRAVENOUS | NDC 69374094905

## 2022-08-27 MED ADMIN — Triamcinolone Acetonide Inj Susp 40 MG/ML: 40 mg | NDC 00003029305

## 2022-08-31 DIAGNOSIS — Z419 Encounter for procedure for purposes other than remedying health state, unspecified: Secondary | ICD-10-CM | POA: Diagnosis not present

## 2022-09-04 DIAGNOSIS — G4733 Obstructive sleep apnea (adult) (pediatric): Secondary | ICD-10-CM

## 2022-09-09 DIAGNOSIS — G8929 Other chronic pain: Secondary | ICD-10-CM | POA: Diagnosis not present

## 2022-09-09 DIAGNOSIS — M47816 Spondylosis without myelopathy or radiculopathy, lumbar region: Secondary | ICD-10-CM | POA: Diagnosis not present

## 2022-09-09 DIAGNOSIS — M5441 Lumbago with sciatica, right side: Secondary | ICD-10-CM | POA: Diagnosis not present

## 2022-09-09 DIAGNOSIS — M48062 Spinal stenosis, lumbar region with neurogenic claudication: Secondary | ICD-10-CM | POA: Diagnosis not present

## 2022-09-22 DIAGNOSIS — E890 Postprocedural hypothyroidism: Secondary | ICD-10-CM

## 2022-09-22 DIAGNOSIS — E876 Hypokalemia: Secondary | ICD-10-CM

## 2022-09-30 DIAGNOSIS — N6099 Unspecified benign mammary dysplasia of unspecified breast: Secondary | ICD-10-CM

## 2022-09-30 DIAGNOSIS — Z1231 Encounter for screening mammogram for malignant neoplasm of breast: Secondary | ICD-10-CM | POA: Diagnosis not present

## 2022-10-01 DIAGNOSIS — Z419 Encounter for procedure for purposes other than remedying health state, unspecified: Secondary | ICD-10-CM | POA: Diagnosis not present

## 2022-10-07 DIAGNOSIS — E785 Hyperlipidemia, unspecified: Secondary | ICD-10-CM

## 2022-10-07 DIAGNOSIS — E119 Type 2 diabetes mellitus without complications: Secondary | ICD-10-CM

## 2022-10-21 DIAGNOSIS — E1159 Type 2 diabetes mellitus with other circulatory complications: Secondary | ICD-10-CM | POA: Diagnosis not present

## 2022-10-21 DIAGNOSIS — F5104 Psychophysiologic insomnia: Secondary | ICD-10-CM | POA: Diagnosis not present

## 2022-10-21 DIAGNOSIS — N898 Other specified noninflammatory disorders of vagina: Secondary | ICD-10-CM | POA: Insufficient documentation

## 2022-10-21 DIAGNOSIS — G4733 Obstructive sleep apnea (adult) (pediatric): Secondary | ICD-10-CM

## 2022-10-21 DIAGNOSIS — R0683 Snoring: Secondary | ICD-10-CM

## 2022-10-21 DIAGNOSIS — B3731 Acute candidiasis of vulva and vagina: Secondary | ICD-10-CM | POA: Diagnosis not present

## 2022-10-21 DIAGNOSIS — E1142 Type 2 diabetes mellitus with diabetic polyneuropathy: Secondary | ICD-10-CM | POA: Diagnosis not present

## 2022-10-21 DIAGNOSIS — I152 Hypertension secondary to endocrine disorders: Secondary | ICD-10-CM

## 2022-10-22 DIAGNOSIS — R0683 Snoring: Secondary | ICD-10-CM | POA: Diagnosis not present

## 2022-10-22 LAB — MICROALBUMIN / CREATININE URINE RATIO
Creatinine,U: 113.8 mg/dL
Microalb Creat Ratio: 0.6 mg/g (ref 0.0–30.0)
Microalb, Ur: 0.7 mg/dL (ref 0.0–1.9)

## 2022-10-23 DIAGNOSIS — B379 Candidiasis, unspecified: Secondary | ICD-10-CM

## 2022-10-26 DIAGNOSIS — B379 Candidiasis, unspecified: Secondary | ICD-10-CM

## 2022-10-28 DIAGNOSIS — B379 Candidiasis, unspecified: Secondary | ICD-10-CM

## 2022-10-30 DIAGNOSIS — Z419 Encounter for procedure for purposes other than remedying health state, unspecified: Secondary | ICD-10-CM | POA: Diagnosis not present

## 2022-11-03 DIAGNOSIS — B379 Candidiasis, unspecified: Secondary | ICD-10-CM

## 2022-11-03 DIAGNOSIS — K9189 Other postprocedural complications and disorders of digestive system: Secondary | ICD-10-CM

## 2022-11-16 DIAGNOSIS — F5104 Psychophysiologic insomnia: Secondary | ICD-10-CM

## 2022-11-30 DIAGNOSIS — Z419 Encounter for procedure for purposes other than remedying health state, unspecified: Secondary | ICD-10-CM | POA: Diagnosis not present

## 2022-12-02 DIAGNOSIS — E1142 Type 2 diabetes mellitus with diabetic polyneuropathy: Secondary | ICD-10-CM

## 2022-12-02 DIAGNOSIS — Z9884 Bariatric surgery status: Secondary | ICD-10-CM

## 2022-12-02 DIAGNOSIS — E890 Postprocedural hypothyroidism: Secondary | ICD-10-CM

## 2022-12-02 DIAGNOSIS — E876 Hypokalemia: Secondary | ICD-10-CM | POA: Diagnosis not present

## 2022-12-02 MED ORDER — SITAGLIPTIN PHOSPHATE 100 MG PO TABS
100.0000 mg | ORAL_TABLET | Freq: Every day | ORAL | 3 refills | Status: DC
Start: 2022-12-02 — End: 2023-05-07

## 2022-12-09 DIAGNOSIS — Z825 Family history of asthma and other chronic lower respiratory diseases: Secondary | ICD-10-CM | POA: Insufficient documentation

## 2022-12-09 DIAGNOSIS — I10 Essential (primary) hypertension: Secondary | ICD-10-CM | POA: Diagnosis not present

## 2022-12-09 DIAGNOSIS — Z87442 Personal history of urinary calculi: Secondary | ICD-10-CM | POA: Insufficient documentation

## 2022-12-09 DIAGNOSIS — F520 Hypoactive sexual desire disorder: Secondary | ICD-10-CM | POA: Diagnosis not present

## 2022-12-09 DIAGNOSIS — Z800 Family history of malignant neoplasm of digestive organs: Secondary | ICD-10-CM | POA: Insufficient documentation

## 2022-12-09 DIAGNOSIS — Z833 Family history of diabetes mellitus: Secondary | ICD-10-CM | POA: Insufficient documentation

## 2022-12-09 DIAGNOSIS — E782 Mixed hyperlipidemia: Secondary | ICD-10-CM | POA: Insufficient documentation

## 2022-12-09 DIAGNOSIS — Z841 Family history of disorders of kidney and ureter: Secondary | ICD-10-CM | POA: Insufficient documentation

## 2022-12-09 DIAGNOSIS — Z811 Family history of alcohol abuse and dependence: Secondary | ICD-10-CM | POA: Diagnosis not present

## 2022-12-09 DIAGNOSIS — N6091 Unspecified benign mammary dysplasia of right breast: Secondary | ICD-10-CM | POA: Diagnosis not present

## 2022-12-09 DIAGNOSIS — E039 Hypothyroidism, unspecified: Secondary | ICD-10-CM | POA: Diagnosis not present

## 2022-12-09 DIAGNOSIS — Z8261 Family history of arthritis: Secondary | ICD-10-CM | POA: Insufficient documentation

## 2022-12-09 DIAGNOSIS — Z9049 Acquired absence of other specified parts of digestive tract: Secondary | ICD-10-CM | POA: Insufficient documentation

## 2022-12-09 DIAGNOSIS — G4730 Sleep apnea, unspecified: Secondary | ICD-10-CM | POA: Insufficient documentation

## 2022-12-09 DIAGNOSIS — Z8249 Family history of ischemic heart disease and other diseases of the circulatory system: Secondary | ICD-10-CM | POA: Insufficient documentation

## 2022-12-09 DIAGNOSIS — Z79810 Long term (current) use of selective estrogen receptor modulators (SERMs): Secondary | ICD-10-CM | POA: Diagnosis not present

## 2022-12-09 DIAGNOSIS — Z818 Family history of other mental and behavioral disorders: Secondary | ICD-10-CM | POA: Insufficient documentation

## 2022-12-09 DIAGNOSIS — Z9884 Bariatric surgery status: Secondary | ICD-10-CM | POA: Insufficient documentation

## 2022-12-09 DIAGNOSIS — Z83719 Family history of colon polyps, unspecified: Secondary | ICD-10-CM | POA: Insufficient documentation

## 2022-12-09 DIAGNOSIS — Z87891 Personal history of nicotine dependence: Secondary | ICD-10-CM | POA: Diagnosis not present

## 2022-12-09 DIAGNOSIS — E119 Type 2 diabetes mellitus without complications: Secondary | ICD-10-CM | POA: Diagnosis not present

## 2022-12-09 DIAGNOSIS — Z79899 Other long term (current) drug therapy: Secondary | ICD-10-CM | POA: Diagnosis not present

## 2022-12-09 DIAGNOSIS — Z8349 Family history of other endocrine, nutritional and metabolic diseases: Secondary | ICD-10-CM | POA: Diagnosis not present

## 2022-12-09 DIAGNOSIS — Z90710 Acquired absence of both cervix and uterus: Secondary | ICD-10-CM | POA: Diagnosis not present

## 2022-12-09 DIAGNOSIS — Z86010 Personal history of colonic polyps: Secondary | ICD-10-CM | POA: Insufficient documentation

## 2022-12-09 DIAGNOSIS — Z814 Family history of other substance abuse and dependence: Secondary | ICD-10-CM | POA: Diagnosis not present

## 2022-12-17 DIAGNOSIS — K9189 Other postprocedural complications and disorders of digestive system: Secondary | ICD-10-CM | POA: Diagnosis not present

## 2022-12-17 DIAGNOSIS — E119 Type 2 diabetes mellitus without complications: Secondary | ICD-10-CM | POA: Diagnosis not present

## 2022-12-17 DIAGNOSIS — K3189 Other diseases of stomach and duodenum: Secondary | ICD-10-CM | POA: Diagnosis not present

## 2022-12-17 DIAGNOSIS — Z79899 Other long term (current) drug therapy: Secondary | ICD-10-CM | POA: Diagnosis not present

## 2022-12-17 DIAGNOSIS — Z980 Intestinal bypass and anastomosis status: Secondary | ICD-10-CM | POA: Diagnosis not present

## 2022-12-17 DIAGNOSIS — K449 Diaphragmatic hernia without obstruction or gangrene: Secondary | ICD-10-CM | POA: Diagnosis not present

## 2022-12-17 DIAGNOSIS — Z87891 Personal history of nicotine dependence: Secondary | ICD-10-CM | POA: Diagnosis not present

## 2022-12-17 DIAGNOSIS — E669 Obesity, unspecified: Secondary | ICD-10-CM | POA: Diagnosis not present

## 2022-12-17 DIAGNOSIS — Z91040 Latex allergy status: Secondary | ICD-10-CM | POA: Diagnosis not present

## 2022-12-17 DIAGNOSIS — Z91048 Other nonmedicinal substance allergy status: Secondary | ICD-10-CM | POA: Diagnosis not present

## 2022-12-17 DIAGNOSIS — Z79890 Hormone replacement therapy (postmenopausal): Secondary | ICD-10-CM | POA: Diagnosis not present

## 2022-12-17 DIAGNOSIS — Z9884 Bariatric surgery status: Secondary | ICD-10-CM | POA: Diagnosis not present

## 2022-12-17 DIAGNOSIS — K9589 Other complications of other bariatric procedure: Secondary | ICD-10-CM | POA: Diagnosis not present

## 2022-12-17 DIAGNOSIS — E039 Hypothyroidism, unspecified: Secondary | ICD-10-CM | POA: Diagnosis not present

## 2022-12-17 DIAGNOSIS — Z6830 Body mass index (BMI) 30.0-30.9, adult: Secondary | ICD-10-CM | POA: Diagnosis not present

## 2022-12-17 DIAGNOSIS — E785 Hyperlipidemia, unspecified: Secondary | ICD-10-CM | POA: Diagnosis not present

## 2022-12-17 DIAGNOSIS — I10 Essential (primary) hypertension: Secondary | ICD-10-CM | POA: Diagnosis not present

## 2022-12-21 DIAGNOSIS — E119 Type 2 diabetes mellitus without complications: Secondary | ICD-10-CM | POA: Diagnosis not present

## 2022-12-21 DIAGNOSIS — I10 Essential (primary) hypertension: Secondary | ICD-10-CM | POA: Insufficient documentation

## 2022-12-21 DIAGNOSIS — X501XXA Overexertion from prolonged static or awkward postures, initial encounter: Secondary | ICD-10-CM | POA: Insufficient documentation

## 2022-12-21 DIAGNOSIS — M79671 Pain in right foot: Secondary | ICD-10-CM | POA: Diagnosis not present

## 2022-12-21 DIAGNOSIS — Z111 Encounter for screening for respiratory tuberculosis: Secondary | ICD-10-CM

## 2022-12-21 DIAGNOSIS — I152 Hypertension secondary to endocrine disorders: Secondary | ICD-10-CM

## 2022-12-21 DIAGNOSIS — E1159 Type 2 diabetes mellitus with other circulatory complications: Secondary | ICD-10-CM

## 2022-12-21 DIAGNOSIS — F39 Unspecified mood [affective] disorder: Secondary | ICD-10-CM | POA: Diagnosis not present

## 2022-12-21 DIAGNOSIS — I700 Atherosclerosis of aorta: Secondary | ICD-10-CM | POA: Diagnosis not present

## 2022-12-21 DIAGNOSIS — S93601A Unspecified sprain of right foot, initial encounter: Secondary | ICD-10-CM | POA: Diagnosis not present

## 2022-12-21 DIAGNOSIS — S93691A Other sprain of right foot, initial encounter: Secondary | ICD-10-CM | POA: Diagnosis not present

## 2022-12-21 DIAGNOSIS — E1142 Type 2 diabetes mellitus with diabetic polyneuropathy: Secondary | ICD-10-CM

## 2022-12-21 DIAGNOSIS — E039 Hypothyroidism, unspecified: Secondary | ICD-10-CM

## 2022-12-23 MED ORDER — SERTRALINE HCL 50 MG PO TABS
75.0000 mg | ORAL_TABLET | Freq: Every day | ORAL | 11 refills | Status: DC
Start: 2022-12-23 — End: 2023-12-27

## 2022-12-27 DIAGNOSIS — F39 Unspecified mood [affective] disorder: Secondary | ICD-10-CM | POA: Insufficient documentation

## 2022-12-27 DIAGNOSIS — Z111 Encounter for screening for respiratory tuberculosis: Secondary | ICD-10-CM | POA: Insufficient documentation

## 2022-12-28 DIAGNOSIS — F5104 Psychophysiologic insomnia: Secondary | ICD-10-CM

## 2022-12-28 MED ORDER — TEMAZEPAM 15 MG PO CAPS
ORAL_CAPSULE | ORAL | 0 refills | Status: DC
Start: 2022-12-28 — End: 2023-01-29

## 2022-12-30 DIAGNOSIS — Z419 Encounter for procedure for purposes other than remedying health state, unspecified: Secondary | ICD-10-CM | POA: Diagnosis not present

## 2023-01-17 DIAGNOSIS — F17200 Nicotine dependence, unspecified, uncomplicated: Secondary | ICD-10-CM

## 2023-01-29 DIAGNOSIS — F5104 Psychophysiologic insomnia: Secondary | ICD-10-CM

## 2023-01-30 DIAGNOSIS — Z419 Encounter for procedure for purposes other than remedying health state, unspecified: Secondary | ICD-10-CM | POA: Diagnosis not present

## 2023-02-03 DIAGNOSIS — Z5986 Financial insecurity: Secondary | ICD-10-CM | POA: Insufficient documentation

## 2023-02-03 DIAGNOSIS — Z800 Family history of malignant neoplasm of digestive organs: Secondary | ICD-10-CM | POA: Diagnosis not present

## 2023-02-03 DIAGNOSIS — Z8261 Family history of arthritis: Secondary | ICD-10-CM | POA: Insufficient documentation

## 2023-02-03 DIAGNOSIS — Z79810 Long term (current) use of selective estrogen receptor modulators (SERMs): Secondary | ICD-10-CM | POA: Diagnosis not present

## 2023-02-03 DIAGNOSIS — E119 Type 2 diabetes mellitus without complications: Secondary | ICD-10-CM | POA: Diagnosis not present

## 2023-02-03 DIAGNOSIS — Z833 Family history of diabetes mellitus: Secondary | ICD-10-CM | POA: Insufficient documentation

## 2023-02-03 DIAGNOSIS — Z79899 Other long term (current) drug therapy: Secondary | ICD-10-CM | POA: Insufficient documentation

## 2023-02-03 DIAGNOSIS — Z83719 Family history of colon polyps, unspecified: Secondary | ICD-10-CM | POA: Insufficient documentation

## 2023-02-03 DIAGNOSIS — Z9049 Acquired absence of other specified parts of digestive tract: Secondary | ICD-10-CM | POA: Insufficient documentation

## 2023-02-03 DIAGNOSIS — Z818 Family history of other mental and behavioral disorders: Secondary | ICD-10-CM | POA: Insufficient documentation

## 2023-02-03 DIAGNOSIS — Z825 Family history of asthma and other chronic lower respiratory diseases: Secondary | ICD-10-CM | POA: Insufficient documentation

## 2023-02-03 DIAGNOSIS — I10 Essential (primary) hypertension: Secondary | ICD-10-CM | POA: Insufficient documentation

## 2023-02-03 DIAGNOSIS — Z841 Family history of disorders of kidney and ureter: Secondary | ICD-10-CM | POA: Insufficient documentation

## 2023-02-03 DIAGNOSIS — Z814 Family history of other substance abuse and dependence: Secondary | ICD-10-CM | POA: Insufficient documentation

## 2023-02-03 DIAGNOSIS — Z9884 Bariatric surgery status: Secondary | ICD-10-CM | POA: Diagnosis not present

## 2023-02-03 DIAGNOSIS — Z8349 Family history of other endocrine, nutritional and metabolic diseases: Secondary | ICD-10-CM | POA: Insufficient documentation

## 2023-02-03 DIAGNOSIS — G4730 Sleep apnea, unspecified: Secondary | ICD-10-CM | POA: Insufficient documentation

## 2023-02-03 DIAGNOSIS — R635 Abnormal weight gain: Secondary | ICD-10-CM

## 2023-02-03 DIAGNOSIS — Z87891 Personal history of nicotine dependence: Secondary | ICD-10-CM | POA: Insufficient documentation

## 2023-02-03 DIAGNOSIS — Z811 Family history of alcohol abuse and dependence: Secondary | ICD-10-CM | POA: Insufficient documentation

## 2023-02-03 DIAGNOSIS — Z8379 Family history of other diseases of the digestive system: Secondary | ICD-10-CM | POA: Insufficient documentation

## 2023-02-03 DIAGNOSIS — N898 Other specified noninflammatory disorders of vagina: Secondary | ICD-10-CM | POA: Insufficient documentation

## 2023-02-03 DIAGNOSIS — Z87442 Personal history of urinary calculi: Secondary | ICD-10-CM | POA: Insufficient documentation

## 2023-02-03 DIAGNOSIS — K56699 Other intestinal obstruction unspecified as to partial versus complete obstruction: Secondary | ICD-10-CM | POA: Diagnosis not present

## 2023-02-03 DIAGNOSIS — Z8249 Family history of ischemic heart disease and other diseases of the circulatory system: Secondary | ICD-10-CM | POA: Insufficient documentation

## 2023-02-03 DIAGNOSIS — Z86010 Personal history of colonic polyps: Secondary | ICD-10-CM | POA: Diagnosis not present

## 2023-02-03 DIAGNOSIS — Z90710 Acquired absence of both cervix and uterus: Secondary | ICD-10-CM | POA: Insufficient documentation

## 2023-02-03 DIAGNOSIS — E039 Hypothyroidism, unspecified: Secondary | ICD-10-CM | POA: Diagnosis not present

## 2023-02-03 DIAGNOSIS — E782 Mixed hyperlipidemia: Secondary | ICD-10-CM | POA: Insufficient documentation

## 2023-02-03 DIAGNOSIS — N6091 Unspecified benign mammary dysplasia of right breast: Secondary | ICD-10-CM | POA: Diagnosis not present

## 2023-02-04 DIAGNOSIS — E119 Type 2 diabetes mellitus without complications: Secondary | ICD-10-CM

## 2023-02-04 DIAGNOSIS — N898 Other specified noninflammatory disorders of vagina: Secondary | ICD-10-CM | POA: Insufficient documentation

## 2023-02-04 DIAGNOSIS — I10 Essential (primary) hypertension: Secondary | ICD-10-CM

## 2023-02-04 DIAGNOSIS — E785 Hyperlipidemia, unspecified: Secondary | ICD-10-CM

## 2023-02-04 DIAGNOSIS — Z7984 Long term (current) use of oral hypoglycemic drugs: Secondary | ICD-10-CM

## 2023-03-01 DIAGNOSIS — Z419 Encounter for procedure for purposes other than remedying health state, unspecified: Secondary | ICD-10-CM | POA: Diagnosis not present

## 2023-03-04 DIAGNOSIS — F5104 Psychophysiologic insomnia: Secondary | ICD-10-CM

## 2023-03-17 DIAGNOSIS — E669 Obesity, unspecified: Secondary | ICD-10-CM | POA: Diagnosis not present

## 2023-03-17 DIAGNOSIS — E1142 Type 2 diabetes mellitus with diabetic polyneuropathy: Secondary | ICD-10-CM | POA: Diagnosis not present

## 2023-03-17 DIAGNOSIS — E785 Hyperlipidemia, unspecified: Secondary | ICD-10-CM | POA: Diagnosis not present

## 2023-03-17 DIAGNOSIS — Z823 Family history of stroke: Secondary | ICD-10-CM | POA: Diagnosis not present

## 2023-03-17 DIAGNOSIS — Z833 Family history of diabetes mellitus: Secondary | ICD-10-CM | POA: Diagnosis not present

## 2023-03-17 DIAGNOSIS — Z7984 Long term (current) use of oral hypoglycemic drugs: Secondary | ICD-10-CM | POA: Diagnosis not present

## 2023-03-17 DIAGNOSIS — Z8249 Family history of ischemic heart disease and other diseases of the circulatory system: Secondary | ICD-10-CM | POA: Diagnosis not present

## 2023-03-17 DIAGNOSIS — M1990 Unspecified osteoarthritis, unspecified site: Secondary | ICD-10-CM | POA: Diagnosis not present

## 2023-03-17 DIAGNOSIS — Z87891 Personal history of nicotine dependence: Secondary | ICD-10-CM | POA: Diagnosis not present

## 2023-03-17 DIAGNOSIS — I10 Essential (primary) hypertension: Secondary | ICD-10-CM | POA: Diagnosis not present

## 2023-03-17 DIAGNOSIS — Z809 Family history of malignant neoplasm, unspecified: Secondary | ICD-10-CM | POA: Diagnosis not present

## 2023-03-17 DIAGNOSIS — K219 Gastro-esophageal reflux disease without esophagitis: Secondary | ICD-10-CM | POA: Diagnosis not present

## 2023-03-29 DIAGNOSIS — F5104 Psychophysiologic insomnia: Secondary | ICD-10-CM

## 2023-04-01 DIAGNOSIS — Z419 Encounter for procedure for purposes other than remedying health state, unspecified: Secondary | ICD-10-CM | POA: Diagnosis not present

## 2023-05-02 DIAGNOSIS — Z419 Encounter for procedure for purposes other than remedying health state, unspecified: Secondary | ICD-10-CM | POA: Diagnosis not present

## 2023-05-07 DIAGNOSIS — F5104 Psychophysiologic insomnia: Secondary | ICD-10-CM

## 2023-05-07 DIAGNOSIS — E118 Type 2 diabetes mellitus with unspecified complications: Secondary | ICD-10-CM

## 2023-05-07 DIAGNOSIS — I700 Atherosclerosis of aorta: Secondary | ICD-10-CM

## 2023-05-07 DIAGNOSIS — E1159 Type 2 diabetes mellitus with other circulatory complications: Secondary | ICD-10-CM

## 2023-05-07 DIAGNOSIS — E1169 Type 2 diabetes mellitus with other specified complication: Secondary | ICD-10-CM

## 2023-05-07 DIAGNOSIS — Z23 Encounter for immunization: Secondary | ICD-10-CM

## 2023-05-07 DIAGNOSIS — F17200 Nicotine dependence, unspecified, uncomplicated: Secondary | ICD-10-CM

## 2023-05-07 DIAGNOSIS — E785 Hyperlipidemia, unspecified: Secondary | ICD-10-CM

## 2023-05-07 DIAGNOSIS — E1142 Type 2 diabetes mellitus with diabetic polyneuropathy: Secondary | ICD-10-CM

## 2023-05-07 DIAGNOSIS — E119 Type 2 diabetes mellitus without complications: Secondary | ICD-10-CM

## 2023-05-07 DIAGNOSIS — R051 Acute cough: Secondary | ICD-10-CM | POA: Diagnosis not present

## 2023-05-07 DIAGNOSIS — I10 Essential (primary) hypertension: Secondary | ICD-10-CM

## 2023-05-07 DIAGNOSIS — K9189 Other postprocedural complications and disorders of digestive system: Secondary | ICD-10-CM | POA: Diagnosis not present

## 2023-05-07 DIAGNOSIS — F39 Unspecified mood [affective] disorder: Secondary | ICD-10-CM

## 2023-05-07 DIAGNOSIS — I152 Hypertension secondary to endocrine disorders: Secondary | ICD-10-CM

## 2023-05-07 DIAGNOSIS — K311 Adult hypertrophic pyloric stenosis: Secondary | ICD-10-CM

## 2023-05-07 DIAGNOSIS — R0982 Postnasal drip: Secondary | ICD-10-CM

## 2023-05-07 DIAGNOSIS — R112 Nausea with vomiting, unspecified: Secondary | ICD-10-CM

## 2023-05-07 DIAGNOSIS — B001 Herpesviral vesicular dermatitis: Secondary | ICD-10-CM

## 2023-05-07 LAB — MICROALBUMIN / CREATININE URINE RATIO
Creatinine,U: 200.8 mg/dL
Microalb Creat Ratio: 0.6 mg/g (ref 0.0–30.0)
Microalb, Ur: 1.3 mg/dL (ref 0.0–1.9)

## 2023-05-07 MED ORDER — SITAGLIPTIN PHOSPHATE 100 MG PO TABS
100.0000 mg | ORAL_TABLET | Freq: Every day | ORAL | 3 refills | Status: DC
Start: 2023-05-07 — End: 2023-06-28

## 2023-05-07 MED ORDER — LOSARTAN POTASSIUM 25 MG PO TABS
25.0000 mg | ORAL_TABLET | Freq: Every day | ORAL | 0 refills | Status: DC
Start: 2023-05-07 — End: 2023-07-14

## 2023-05-07 MED ORDER — VALACYCLOVIR HCL 1 G PO TABS
1000.0000 mg | ORAL_TABLET | Freq: Two times a day (BID) | ORAL | Status: AC | PRN
Start: 2023-05-07 — End: ?

## 2023-05-07 MED ORDER — TEMAZEPAM 15 MG PO CAPS
ORAL_CAPSULE | ORAL | 0 refills | Status: DC
Start: 2023-05-07 — End: 2023-06-14

## 2023-05-07 MED ORDER — GABAPENTIN 300 MG PO CAPS
300.0000 mg | ORAL_CAPSULE | Freq: Two times a day (BID) | ORAL | Status: DC
Start: 2023-05-07 — End: 2023-07-14

## 2023-05-13 DIAGNOSIS — R058 Other specified cough: Secondary | ICD-10-CM

## 2023-05-13 MED ORDER — BENZONATATE 200 MG PO CAPS
200.0000 mg | ORAL_CAPSULE | Freq: Two times a day (BID) | ORAL | 0 refills | Status: AC | PRN
Start: 2023-05-13 — End: ?

## 2023-05-22 DIAGNOSIS — R0982 Postnasal drip: Secondary | ICD-10-CM | POA: Insufficient documentation

## 2023-05-22 DIAGNOSIS — R051 Acute cough: Secondary | ICD-10-CM | POA: Insufficient documentation

## 2023-05-22 DIAGNOSIS — Z23 Encounter for immunization: Secondary | ICD-10-CM | POA: Insufficient documentation

## 2023-05-22 DIAGNOSIS — B001 Herpesviral vesicular dermatitis: Secondary | ICD-10-CM | POA: Insufficient documentation

## 2023-06-01 DIAGNOSIS — J019 Acute sinusitis, unspecified: Secondary | ICD-10-CM | POA: Insufficient documentation

## 2023-06-01 DIAGNOSIS — Z419 Encounter for procedure for purposes other than remedying health state, unspecified: Secondary | ICD-10-CM | POA: Diagnosis not present

## 2023-06-01 MED ORDER — PSEUDOEPH-BROMPHEN-DM 30-2-10 MG/5ML PO SYRP
5.0000 mL | ORAL_SOLUTION | Freq: Four times a day (QID) | ORAL | 0 refills | Status: DC | PRN
Start: 2023-06-01 — End: 2023-07-14

## 2023-06-01 MED ORDER — AMOXICILLIN-POT CLAVULANATE 875-125 MG PO TABS
1.0000 | ORAL_TABLET | Freq: Two times a day (BID) | ORAL | 0 refills | Status: DC
Start: 2023-06-01 — End: 2023-07-14

## 2023-06-10 DIAGNOSIS — G4730 Sleep apnea, unspecified: Secondary | ICD-10-CM | POA: Diagnosis not present

## 2023-06-10 DIAGNOSIS — Z860101 Personal history of adenomatous and serrated colon polyps: Secondary | ICD-10-CM | POA: Insufficient documentation

## 2023-06-10 DIAGNOSIS — Z90710 Acquired absence of both cervix and uterus: Secondary | ICD-10-CM | POA: Diagnosis not present

## 2023-06-10 DIAGNOSIS — Z87442 Personal history of urinary calculi: Secondary | ICD-10-CM | POA: Diagnosis not present

## 2023-06-10 DIAGNOSIS — Z9049 Acquired absence of other specified parts of digestive tract: Secondary | ICD-10-CM | POA: Insufficient documentation

## 2023-06-10 DIAGNOSIS — Z8261 Family history of arthritis: Secondary | ICD-10-CM | POA: Diagnosis not present

## 2023-06-10 DIAGNOSIS — Z800 Family history of malignant neoplasm of digestive organs: Secondary | ICD-10-CM | POA: Insufficient documentation

## 2023-06-10 DIAGNOSIS — Z83719 Family history of colon polyps, unspecified: Secondary | ICD-10-CM | POA: Diagnosis not present

## 2023-06-10 DIAGNOSIS — Z79811 Long term (current) use of aromatase inhibitors: Secondary | ICD-10-CM | POA: Diagnosis not present

## 2023-06-10 DIAGNOSIS — K56699 Other intestinal obstruction unspecified as to partial versus complete obstruction: Secondary | ICD-10-CM | POA: Insufficient documentation

## 2023-06-10 DIAGNOSIS — R232 Flushing: Secondary | ICD-10-CM | POA: Diagnosis not present

## 2023-06-10 DIAGNOSIS — N6091 Unspecified benign mammary dysplasia of right breast: Secondary | ICD-10-CM | POA: Insufficient documentation

## 2023-06-10 DIAGNOSIS — Z833 Family history of diabetes mellitus: Secondary | ICD-10-CM | POA: Insufficient documentation

## 2023-06-10 DIAGNOSIS — Z9884 Bariatric surgery status: Secondary | ICD-10-CM | POA: Insufficient documentation

## 2023-06-10 DIAGNOSIS — L659 Nonscarring hair loss, unspecified: Secondary | ICD-10-CM

## 2023-06-10 DIAGNOSIS — Z8349 Family history of other endocrine, nutritional and metabolic diseases: Secondary | ICD-10-CM | POA: Insufficient documentation

## 2023-06-10 DIAGNOSIS — I10 Essential (primary) hypertension: Secondary | ICD-10-CM | POA: Insufficient documentation

## 2023-06-10 DIAGNOSIS — F32A Depression, unspecified: Secondary | ICD-10-CM | POA: Insufficient documentation

## 2023-06-10 DIAGNOSIS — E119 Type 2 diabetes mellitus without complications: Secondary | ICD-10-CM | POA: Diagnosis not present

## 2023-06-10 DIAGNOSIS — Z5986 Financial insecurity: Secondary | ICD-10-CM | POA: Insufficient documentation

## 2023-06-10 DIAGNOSIS — Z87891 Personal history of nicotine dependence: Secondary | ICD-10-CM | POA: Diagnosis not present

## 2023-06-10 DIAGNOSIS — Z79899 Other long term (current) drug therapy: Secondary | ICD-10-CM | POA: Diagnosis not present

## 2023-06-10 DIAGNOSIS — R5383 Other fatigue: Secondary | ICD-10-CM | POA: Diagnosis not present

## 2023-06-10 DIAGNOSIS — Z8249 Family history of ischemic heart disease and other diseases of the circulatory system: Secondary | ICD-10-CM | POA: Insufficient documentation

## 2023-06-10 DIAGNOSIS — Z83438 Family history of other disorder of lipoprotein metabolism and other lipidemia: Secondary | ICD-10-CM | POA: Diagnosis not present

## 2023-06-13 DIAGNOSIS — F39 Unspecified mood [affective] disorder: Secondary | ICD-10-CM

## 2023-06-28 DIAGNOSIS — R748 Abnormal levels of other serum enzymes: Secondary | ICD-10-CM | POA: Diagnosis not present

## 2023-06-28 DIAGNOSIS — E890 Postprocedural hypothyroidism: Secondary | ICD-10-CM

## 2023-06-28 DIAGNOSIS — Z7984 Long term (current) use of oral hypoglycemic drugs: Secondary | ICD-10-CM | POA: Diagnosis not present

## 2023-06-28 DIAGNOSIS — E1142 Type 2 diabetes mellitus with diabetic polyneuropathy: Secondary | ICD-10-CM | POA: Diagnosis not present

## 2023-06-28 DIAGNOSIS — E118 Type 2 diabetes mellitus with unspecified complications: Secondary | ICD-10-CM | POA: Diagnosis not present

## 2023-07-02 DIAGNOSIS — Z419 Encounter for procedure for purposes other than remedying health state, unspecified: Secondary | ICD-10-CM | POA: Diagnosis not present

## 2023-07-14 DIAGNOSIS — E1169 Type 2 diabetes mellitus with other specified complication: Secondary | ICD-10-CM | POA: Diagnosis not present

## 2023-07-14 DIAGNOSIS — N760 Acute vaginitis: Secondary | ICD-10-CM | POA: Diagnosis not present

## 2023-07-14 DIAGNOSIS — N898 Other specified noninflammatory disorders of vagina: Secondary | ICD-10-CM

## 2023-07-14 DIAGNOSIS — M5441 Lumbago with sciatica, right side: Secondary | ICD-10-CM

## 2023-07-14 DIAGNOSIS — K9189 Other postprocedural complications and disorders of digestive system: Secondary | ICD-10-CM | POA: Diagnosis not present

## 2023-07-14 DIAGNOSIS — I152 Hypertension secondary to endocrine disorders: Secondary | ICD-10-CM

## 2023-07-14 DIAGNOSIS — G8929 Other chronic pain: Secondary | ICD-10-CM

## 2023-07-14 DIAGNOSIS — Z7984 Long term (current) use of oral hypoglycemic drugs: Secondary | ICD-10-CM

## 2023-07-14 DIAGNOSIS — E1159 Type 2 diabetes mellitus with other circulatory complications: Secondary | ICD-10-CM

## 2023-07-14 DIAGNOSIS — E785 Hyperlipidemia, unspecified: Secondary | ICD-10-CM

## 2023-07-14 DIAGNOSIS — B9689 Other specified bacterial agents as the cause of diseases classified elsewhere: Secondary | ICD-10-CM | POA: Insufficient documentation

## 2023-07-14 DIAGNOSIS — E890 Postprocedural hypothyroidism: Secondary | ICD-10-CM

## 2023-07-14 DIAGNOSIS — E118 Type 2 diabetes mellitus with unspecified complications: Secondary | ICD-10-CM

## 2023-07-14 MED ORDER — OMEPRAZOLE 40 MG PO CPDR
40.0000 mg | DELAYED_RELEASE_CAPSULE | Freq: Two times a day (BID) | ORAL | 3 refills | Status: DC
Start: 2023-07-14 — End: 2024-04-10

## 2023-07-15 DIAGNOSIS — E118 Type 2 diabetes mellitus with unspecified complications: Secondary | ICD-10-CM

## 2023-07-19 DIAGNOSIS — F39 Unspecified mood [affective] disorder: Secondary | ICD-10-CM

## 2023-07-19 DIAGNOSIS — F5104 Psychophysiologic insomnia: Secondary | ICD-10-CM

## 2023-07-19 DIAGNOSIS — B9689 Other specified bacterial agents as the cause of diseases classified elsewhere: Secondary | ICD-10-CM

## 2023-07-20 DIAGNOSIS — F5104 Psychophysiologic insomnia: Secondary | ICD-10-CM

## 2023-08-01 DIAGNOSIS — Z419 Encounter for procedure for purposes other than remedying health state, unspecified: Secondary | ICD-10-CM | POA: Diagnosis not present

## 2023-08-26 DIAGNOSIS — K311 Adult hypertrophic pyloric stenosis: Secondary | ICD-10-CM

## 2023-08-26 DIAGNOSIS — K9189 Other postprocedural complications and disorders of digestive system: Secondary | ICD-10-CM

## 2023-09-01 DIAGNOSIS — Z419 Encounter for procedure for purposes other than remedying health state, unspecified: Secondary | ICD-10-CM | POA: Diagnosis not present

## 2023-09-14 DIAGNOSIS — F5104 Psychophysiologic insomnia: Secondary | ICD-10-CM

## 2023-09-14 DIAGNOSIS — E118 Type 2 diabetes mellitus with unspecified complications: Secondary | ICD-10-CM

## 2023-10-02 DIAGNOSIS — Z419 Encounter for procedure for purposes other than remedying health state, unspecified: Secondary | ICD-10-CM | POA: Diagnosis not present

## 2023-10-05 DIAGNOSIS — F5104 Psychophysiologic insomnia: Secondary | ICD-10-CM

## 2023-10-19 DIAGNOSIS — E1169 Type 2 diabetes mellitus with other specified complication: Secondary | ICD-10-CM

## 2023-10-19 DIAGNOSIS — E1159 Type 2 diabetes mellitus with other circulatory complications: Secondary | ICD-10-CM

## 2023-10-19 DIAGNOSIS — I152 Hypertension secondary to endocrine disorders: Secondary | ICD-10-CM | POA: Diagnosis not present

## 2023-10-19 DIAGNOSIS — E785 Hyperlipidemia, unspecified: Secondary | ICD-10-CM | POA: Diagnosis not present

## 2023-10-19 DIAGNOSIS — K9189 Other postprocedural complications and disorders of digestive system: Secondary | ICD-10-CM | POA: Diagnosis not present

## 2023-10-19 DIAGNOSIS — E118 Type 2 diabetes mellitus with unspecified complications: Secondary | ICD-10-CM | POA: Diagnosis not present

## 2023-10-19 DIAGNOSIS — E890 Postprocedural hypothyroidism: Secondary | ICD-10-CM | POA: Diagnosis not present

## 2023-10-19 DIAGNOSIS — F5104 Psychophysiologic insomnia: Secondary | ICD-10-CM

## 2023-10-22 DIAGNOSIS — E559 Vitamin D deficiency, unspecified: Secondary | ICD-10-CM

## 2023-10-30 DIAGNOSIS — Z419 Encounter for procedure for purposes other than remedying health state, unspecified: Secondary | ICD-10-CM | POA: Diagnosis not present

## 2023-12-09 DIAGNOSIS — Z79899 Other long term (current) drug therapy: Secondary | ICD-10-CM | POA: Insufficient documentation

## 2023-12-09 DIAGNOSIS — G4730 Sleep apnea, unspecified: Secondary | ICD-10-CM | POA: Diagnosis not present

## 2023-12-09 DIAGNOSIS — N63 Unspecified lump in unspecified breast: Secondary | ICD-10-CM | POA: Insufficient documentation

## 2023-12-09 DIAGNOSIS — Z5986 Financial insecurity: Secondary | ICD-10-CM | POA: Diagnosis not present

## 2023-12-09 DIAGNOSIS — Z825 Family history of asthma and other chronic lower respiratory diseases: Secondary | ICD-10-CM | POA: Insufficient documentation

## 2023-12-09 DIAGNOSIS — Z8249 Family history of ischemic heart disease and other diseases of the circulatory system: Secondary | ICD-10-CM | POA: Insufficient documentation

## 2023-12-09 DIAGNOSIS — Z79890 Hormone replacement therapy (postmenopausal): Secondary | ICD-10-CM | POA: Diagnosis not present

## 2023-12-09 DIAGNOSIS — Z814 Family history of other substance abuse and dependence: Secondary | ICD-10-CM | POA: Insufficient documentation

## 2023-12-09 DIAGNOSIS — N6459 Other signs and symptoms in breast: Secondary | ICD-10-CM | POA: Insufficient documentation

## 2023-12-09 DIAGNOSIS — E782 Mixed hyperlipidemia: Secondary | ICD-10-CM | POA: Diagnosis not present

## 2023-12-09 DIAGNOSIS — N6091 Unspecified benign mammary dysplasia of right breast: Secondary | ICD-10-CM | POA: Insufficient documentation

## 2023-12-09 DIAGNOSIS — Z9049 Acquired absence of other specified parts of digestive tract: Secondary | ICD-10-CM | POA: Diagnosis not present

## 2023-12-09 DIAGNOSIS — R232 Flushing: Secondary | ICD-10-CM | POA: Insufficient documentation

## 2023-12-09 DIAGNOSIS — Z818 Family history of other mental and behavioral disorders: Secondary | ICD-10-CM | POA: Insufficient documentation

## 2023-12-09 DIAGNOSIS — Z800 Family history of malignant neoplasm of digestive organs: Secondary | ICD-10-CM | POA: Insufficient documentation

## 2023-12-09 DIAGNOSIS — Z79811 Long term (current) use of aromatase inhibitors: Secondary | ICD-10-CM | POA: Diagnosis not present

## 2023-12-09 DIAGNOSIS — M25473 Effusion, unspecified ankle: Secondary | ICD-10-CM | POA: Insufficient documentation

## 2023-12-09 DIAGNOSIS — Z8261 Family history of arthritis: Secondary | ICD-10-CM | POA: Insufficient documentation

## 2023-12-09 DIAGNOSIS — N951 Menopausal and female climacteric states: Secondary | ICD-10-CM | POA: Insufficient documentation

## 2023-12-09 DIAGNOSIS — Z841 Family history of disorders of kidney and ureter: Secondary | ICD-10-CM | POA: Insufficient documentation

## 2023-12-09 DIAGNOSIS — G4700 Insomnia, unspecified: Secondary | ICD-10-CM | POA: Diagnosis not present

## 2023-12-09 DIAGNOSIS — Z83438 Family history of other disorder of lipoprotein metabolism and other lipidemia: Secondary | ICD-10-CM | POA: Insufficient documentation

## 2023-12-09 DIAGNOSIS — I10 Essential (primary) hypertension: Secondary | ICD-10-CM | POA: Diagnosis not present

## 2023-12-09 DIAGNOSIS — G8929 Other chronic pain: Secondary | ICD-10-CM | POA: Diagnosis not present

## 2023-12-09 DIAGNOSIS — M7989 Other specified soft tissue disorders: Secondary | ICD-10-CM | POA: Insufficient documentation

## 2023-12-09 DIAGNOSIS — Z811 Family history of alcohol abuse and dependence: Secondary | ICD-10-CM | POA: Insufficient documentation

## 2023-12-09 DIAGNOSIS — E119 Type 2 diabetes mellitus without complications: Secondary | ICD-10-CM | POA: Diagnosis not present

## 2023-12-09 DIAGNOSIS — Z87442 Personal history of urinary calculi: Secondary | ICD-10-CM | POA: Diagnosis not present

## 2023-12-09 DIAGNOSIS — Z87891 Personal history of nicotine dependence: Secondary | ICD-10-CM | POA: Diagnosis not present

## 2023-12-09 DIAGNOSIS — Z860101 Personal history of adenomatous and serrated colon polyps: Secondary | ICD-10-CM | POA: Diagnosis not present

## 2023-12-09 DIAGNOSIS — Z90710 Acquired absence of both cervix and uterus: Secondary | ICD-10-CM | POA: Insufficient documentation

## 2023-12-09 DIAGNOSIS — Z83719 Family history of colon polyps, unspecified: Secondary | ICD-10-CM | POA: Insufficient documentation

## 2023-12-09 DIAGNOSIS — E039 Hypothyroidism, unspecified: Secondary | ICD-10-CM | POA: Diagnosis not present

## 2023-12-09 DIAGNOSIS — M549 Dorsalgia, unspecified: Secondary | ICD-10-CM | POA: Diagnosis not present

## 2023-12-09 DIAGNOSIS — R0683 Snoring: Secondary | ICD-10-CM | POA: Diagnosis not present

## 2023-12-09 DIAGNOSIS — Z833 Family history of diabetes mellitus: Secondary | ICD-10-CM | POA: Insufficient documentation

## 2023-12-09 DIAGNOSIS — Z9884 Bariatric surgery status: Secondary | ICD-10-CM | POA: Insufficient documentation

## 2023-12-11 DIAGNOSIS — Z419 Encounter for procedure for purposes other than remedying health state, unspecified: Secondary | ICD-10-CM | POA: Diagnosis not present

## 2023-12-15 DIAGNOSIS — G8929 Other chronic pain: Secondary | ICD-10-CM

## 2023-12-22 DIAGNOSIS — E118 Type 2 diabetes mellitus with unspecified complications: Secondary | ICD-10-CM

## 2023-12-27 DIAGNOSIS — F39 Unspecified mood [affective] disorder: Secondary | ICD-10-CM

## 2024-01-10 DIAGNOSIS — Z7984 Long term (current) use of oral hypoglycemic drugs: Secondary | ICD-10-CM | POA: Diagnosis not present

## 2024-01-10 DIAGNOSIS — E890 Postprocedural hypothyroidism: Secondary | ICD-10-CM | POA: Diagnosis not present

## 2024-01-10 DIAGNOSIS — E1142 Type 2 diabetes mellitus with diabetic polyneuropathy: Secondary | ICD-10-CM | POA: Diagnosis not present

## 2024-01-10 DIAGNOSIS — R748 Abnormal levels of other serum enzymes: Secondary | ICD-10-CM

## 2024-01-10 DIAGNOSIS — E118 Type 2 diabetes mellitus with unspecified complications: Secondary | ICD-10-CM

## 2024-01-10 DIAGNOSIS — Z9884 Bariatric surgery status: Secondary | ICD-10-CM | POA: Diagnosis not present

## 2024-01-10 DIAGNOSIS — R635 Abnormal weight gain: Secondary | ICD-10-CM

## 2024-01-10 DIAGNOSIS — Z419 Encounter for procedure for purposes other than remedying health state, unspecified: Secondary | ICD-10-CM | POA: Diagnosis not present

## 2024-01-21 DIAGNOSIS — E118 Type 2 diabetes mellitus with unspecified complications: Secondary | ICD-10-CM

## 2024-02-10 DIAGNOSIS — Z419 Encounter for procedure for purposes other than remedying health state, unspecified: Secondary | ICD-10-CM | POA: Diagnosis not present

## 2024-02-11 DIAGNOSIS — F5104 Psychophysiologic insomnia: Secondary | ICD-10-CM

## 2024-02-11 DIAGNOSIS — R0982 Postnasal drip: Secondary | ICD-10-CM

## 2024-03-06 DIAGNOSIS — M25512 Pain in left shoulder: Secondary | ICD-10-CM

## 2024-03-08 DIAGNOSIS — M19012 Primary osteoarthritis, left shoulder: Secondary | ICD-10-CM | POA: Diagnosis not present

## 2024-03-08 DIAGNOSIS — M25512 Pain in left shoulder: Secondary | ICD-10-CM | POA: Diagnosis not present

## 2024-03-08 DIAGNOSIS — M85612 Other cyst of bone, left shoulder: Secondary | ICD-10-CM | POA: Diagnosis not present

## 2024-03-11 DIAGNOSIS — Z419 Encounter for procedure for purposes other than remedying health state, unspecified: Secondary | ICD-10-CM | POA: Diagnosis not present

## 2024-04-10 DIAGNOSIS — E1159 Type 2 diabetes mellitus with other circulatory complications: Secondary | ICD-10-CM

## 2024-04-10 DIAGNOSIS — K9189 Other postprocedural complications and disorders of digestive system: Secondary | ICD-10-CM

## 2024-04-11 DIAGNOSIS — Z419 Encounter for procedure for purposes other than remedying health state, unspecified: Secondary | ICD-10-CM | POA: Diagnosis not present

## 2024-04-19 DIAGNOSIS — Z7984 Long term (current) use of oral hypoglycemic drugs: Secondary | ICD-10-CM

## 2024-04-19 DIAGNOSIS — E118 Type 2 diabetes mellitus with unspecified complications: Secondary | ICD-10-CM | POA: Diagnosis not present

## 2024-04-19 DIAGNOSIS — E785 Hyperlipidemia, unspecified: Secondary | ICD-10-CM | POA: Diagnosis not present

## 2024-04-19 DIAGNOSIS — E1169 Type 2 diabetes mellitus with other specified complication: Secondary | ICD-10-CM

## 2024-04-19 DIAGNOSIS — E890 Postprocedural hypothyroidism: Secondary | ICD-10-CM | POA: Diagnosis not present

## 2024-04-19 DIAGNOSIS — E1159 Type 2 diabetes mellitus with other circulatory complications: Secondary | ICD-10-CM | POA: Diagnosis not present

## 2024-04-19 DIAGNOSIS — I152 Hypertension secondary to endocrine disorders: Secondary | ICD-10-CM | POA: Diagnosis not present

## 2024-04-19 DIAGNOSIS — M25512 Pain in left shoulder: Secondary | ICD-10-CM

## 2024-04-19 DIAGNOSIS — G8929 Other chronic pain: Secondary | ICD-10-CM

## 2024-04-19 DIAGNOSIS — F5104 Psychophysiologic insomnia: Secondary | ICD-10-CM

## 2024-04-19 DIAGNOSIS — F339 Major depressive disorder, recurrent, unspecified: Secondary | ICD-10-CM

## 2024-04-21 DIAGNOSIS — E1169 Type 2 diabetes mellitus with other specified complication: Secondary | ICD-10-CM

## 2024-05-02 DIAGNOSIS — G8929 Other chronic pain: Secondary | ICD-10-CM | POA: Insufficient documentation

## 2024-05-09 DIAGNOSIS — M542 Cervicalgia: Secondary | ICD-10-CM | POA: Diagnosis not present

## 2024-05-09 DIAGNOSIS — M6281 Muscle weakness (generalized): Secondary | ICD-10-CM | POA: Insufficient documentation

## 2024-05-09 DIAGNOSIS — M25512 Pain in left shoulder: Secondary | ICD-10-CM | POA: Insufficient documentation

## 2024-05-09 DIAGNOSIS — G8929 Other chronic pain: Secondary | ICD-10-CM | POA: Insufficient documentation

## 2024-05-12 DIAGNOSIS — Z419 Encounter for procedure for purposes other than remedying health state, unspecified: Secondary | ICD-10-CM | POA: Diagnosis not present

## 2024-05-16 DIAGNOSIS — F17200 Nicotine dependence, unspecified, uncomplicated: Secondary | ICD-10-CM

## 2024-05-17 DIAGNOSIS — M25512 Pain in left shoulder: Secondary | ICD-10-CM

## 2024-05-17 DIAGNOSIS — M6281 Muscle weakness (generalized): Secondary | ICD-10-CM | POA: Diagnosis not present

## 2024-05-17 DIAGNOSIS — M542 Cervicalgia: Secondary | ICD-10-CM

## 2024-05-17 DIAGNOSIS — G8929 Other chronic pain: Secondary | ICD-10-CM | POA: Diagnosis not present

## 2024-05-21 DIAGNOSIS — G8929 Other chronic pain: Secondary | ICD-10-CM

## 2024-05-24 DIAGNOSIS — M6281 Muscle weakness (generalized): Secondary | ICD-10-CM | POA: Diagnosis not present

## 2024-05-24 DIAGNOSIS — M542 Cervicalgia: Secondary | ICD-10-CM

## 2024-05-24 DIAGNOSIS — M25512 Pain in left shoulder: Secondary | ICD-10-CM | POA: Diagnosis not present

## 2024-05-24 DIAGNOSIS — G8929 Other chronic pain: Secondary | ICD-10-CM | POA: Diagnosis not present

## 2024-05-29 DIAGNOSIS — M6281 Muscle weakness (generalized): Secondary | ICD-10-CM

## 2024-05-29 DIAGNOSIS — G8929 Other chronic pain: Secondary | ICD-10-CM | POA: Diagnosis not present

## 2024-05-29 DIAGNOSIS — M542 Cervicalgia: Secondary | ICD-10-CM

## 2024-05-29 DIAGNOSIS — M25512 Pain in left shoulder: Secondary | ICD-10-CM | POA: Diagnosis not present

## 2024-06-01 DIAGNOSIS — M6281 Muscle weakness (generalized): Secondary | ICD-10-CM | POA: Diagnosis present

## 2024-06-01 DIAGNOSIS — M25512 Pain in left shoulder: Secondary | ICD-10-CM | POA: Insufficient documentation

## 2024-06-01 DIAGNOSIS — M542 Cervicalgia: Secondary | ICD-10-CM | POA: Diagnosis present

## 2024-07-12 DIAGNOSIS — E1142 Type 2 diabetes mellitus with diabetic polyneuropathy: Secondary | ICD-10-CM | POA: Diagnosis not present

## 2024-07-12 DIAGNOSIS — E890 Postprocedural hypothyroidism: Secondary | ICD-10-CM | POA: Diagnosis not present

## 2024-07-12 DIAGNOSIS — R748 Abnormal levels of other serum enzymes: Secondary | ICD-10-CM | POA: Diagnosis not present

## 2024-07-12 DIAGNOSIS — Z7984 Long term (current) use of oral hypoglycemic drugs: Secondary | ICD-10-CM | POA: Diagnosis not present

## 2024-07-13 DIAGNOSIS — E2839 Other primary ovarian failure: Secondary | ICD-10-CM
